# Patient Record
Sex: Male | Born: 1975 | Hispanic: No | Marital: Married | Smoking: Never smoker
Health system: Southern US, Community
[De-identification: ages and names within clinical notes are randomized; demographics above are authoritative.]

## PROBLEM LIST (undated history)

## (undated) DIAGNOSIS — G8929 Other chronic pain: Secondary | ICD-10-CM

## (undated) DIAGNOSIS — M549 Dorsalgia, unspecified: Secondary | ICD-10-CM

## (undated) DIAGNOSIS — I10 Essential (primary) hypertension: Secondary | ICD-10-CM

## (undated) HISTORY — DX: Other chronic pain: G89.29

## (undated) HISTORY — DX: Dorsalgia, unspecified: M54.9

## (undated) HISTORY — PX: APPENDECTOMY: SHX54

---

## 1998-10-13 ENCOUNTER — Emergency Department (HOSPITAL_COMMUNITY): Admission: EM | Admit: 1998-10-13 | Discharge: 1998-10-13 | Payer: Self-pay | Admitting: Emergency Medicine

## 1998-10-13 ENCOUNTER — Encounter: Payer: Self-pay | Admitting: Emergency Medicine

## 2004-06-23 ENCOUNTER — Emergency Department (HOSPITAL_COMMUNITY): Admission: EM | Admit: 2004-06-23 | Discharge: 2004-06-24 | Payer: Self-pay | Admitting: Emergency Medicine

## 2009-01-15 ENCOUNTER — Ambulatory Visit (HOSPITAL_COMMUNITY): Admission: EM | Admit: 2009-01-15 | Discharge: 2009-01-16 | Payer: Self-pay | Admitting: Emergency Medicine

## 2009-01-15 ENCOUNTER — Encounter (INDEPENDENT_AMBULATORY_CARE_PROVIDER_SITE_OTHER): Payer: Self-pay | Admitting: General Surgery

## 2010-11-02 LAB — CBC
HCT: 40.1 % (ref 39.0–52.0)
HCT: 45.5 % (ref 39.0–52.0)
Hemoglobin: 13.8 g/dL (ref 13.0–17.0)
Hemoglobin: 16 g/dL (ref 13.0–17.0)
MCHC: 35.1 g/dL (ref 30.0–36.0)
MCV: 97.1 fL (ref 78.0–100.0)
Platelets: 227 10*3/uL (ref 150–400)
Platelets: 267 K/uL (ref 150–400)
RBC: 4.69 MIL/uL (ref 4.22–5.81)
RDW: 12.8 % (ref 11.5–15.5)
WBC: 14.7 10*3/uL — ABNORMAL HIGH (ref 4.0–10.5)
WBC: 21.1 K/uL — ABNORMAL HIGH (ref 4.0–10.5)

## 2010-11-02 LAB — COMPREHENSIVE METABOLIC PANEL
BUN: 8 mg/dL (ref 6–23)
CO2: 27 mEq/L (ref 19–32)
Calcium: 9.5 mg/dL (ref 8.4–10.5)
Creatinine, Ser: 0.96 mg/dL (ref 0.4–1.5)
GFR calc non Af Amer: >60 mL/min (ref >60)
Glucose, Bld: 106 mg/dL — ABNORMAL HIGH (ref 70–99)

## 2010-11-02 LAB — URINALYSIS, ROUTINE W REFLEX MICROSCOPIC
Bilirubin Urine: NEGATIVE
Glucose, UA: NEGATIVE mg/dL
Hgb urine dipstick: NEGATIVE
Ketones, ur: NEGATIVE mg/dL
Nitrite: NEGATIVE
Protein, ur: NEGATIVE mg/dL
Specific Gravity, Urine: 1.017 (ref 1.005–1.030)
Urobilinogen, UA: 0.2 mg/dL (ref 0.0–1.0)
pH: 6 (ref 5.0–8.0)

## 2010-11-02 LAB — COMPREHENSIVE METABOLIC PANEL WITH GFR
ALT: 23 U/L (ref 0–53)
AST: 31 U/L (ref 0–37)
Albumin: 4.6 g/dL (ref 3.5–5.2)
Alkaline Phosphatase: 112 U/L (ref 39–117)
Chloride: 101 meq/L (ref 96–112)
GFR calc Af Amer: >60 mL/min (ref >60)
Potassium: 3.3 meq/L — ABNORMAL LOW (ref 3.5–5.1)
Sodium: 138 meq/L (ref 135–145)
Total Bilirubin: 2.6 mg/dL — ABNORMAL HIGH (ref 0.3–1.2)
Total Protein: 7.5 g/dL (ref 6.0–8.3)

## 2010-11-02 LAB — LIPASE, BLOOD: Lipase: 23 U/L (ref 11–59)

## 2010-11-02 LAB — DIFFERENTIAL
Basophils Absolute: 0 K/uL (ref 0.0–0.1)
Basophils Relative: 0 % (ref 0–1)
Eosinophils Absolute: 0 10*3/uL (ref 0.0–0.7)
Eosinophils Relative: 0 % (ref 0–5)
Lymphocytes Relative: 6 % — ABNORMAL LOW (ref 12–46)
Lymphs Abs: 1.3 10*3/uL (ref 0.7–4.0)
Monocytes Absolute: 1 K/uL (ref 0.1–1.0)
Monocytes Relative: 5 % (ref 3–12)
Neutro Abs: 18.8 10*3/uL — ABNORMAL HIGH (ref 1.7–7.7)
Neutrophils Relative %: 89 % — ABNORMAL HIGH (ref 43–77)

## 2010-12-08 NOTE — Op Note (Signed)
NAMERUBY, DILONE NO.:  [id number]   MEDICAL RECORD NO.:  [id number]          PATIENT TYPE:  INP   LOCATION:  0098                         FACILITY:  [facility]   PHYSICIAN:  Juanetta Gosling, MDDATE OF BIRTH:  [birth year]   DATE OF PROCEDURE:  01/15/2009  DATE OF DISCHARGE:                               OPERATIVE REPORT   REFERRING PHYSICIAN:  Gavin Pound. Ghim, MD.   PRIMARY PHYSICIAN:  Stan Head Daub, MD.   PREOPERATIVE DIAGNOSIS:  Acute appendicitis.   POSTOPERATIVE DIAGNOSIS:  Acute appendicitis.   PROCEDURE:  Laparoscopic appendectomy.   SURGEON:  Juanetta Gosling, MD   ASSISTANT:  None.   ANESTHESIA:  General.   FINDINGS:  Acute suppurative appendicitis.   SPECIMENS:  Appendix to pathology.   ESTIMATED BLOOD LOSS:  Minimal.   COMPLICATIONS:  None.   DRAINS:  None.   DISPOSITION:  To recovery room in stable condition.   INDICATIONS:  Mr. Lillard Anes is a 35 year old male with about a 12-hour  history of right lower quadrant pain that has become progressively  worse.  He has a white blood cell count of 21,000, focal right lower  quadrant pain with a positive Rovsing sign and a CT confirming  appendicitis.  He and I discussed laparoscopic, possible open  appendectomy.   PROCEDURE:  After informed consent was obtained, the patient was taken  to the operating room, and he was administered 3 grams of intravenous  Unasyn.  Sequential compression devices were placed on the lower  extremities prior to operation.  He was then taken to the operating room  and placed under general endotracheal anesthesia without complication.  His arms were tucked and appropriately padded.  Foley catheter was  placed without complication.  His abdomen was then prepped and draped in  standard sterile surgical fashion.  Surgical time-out was then  performed.   A 10-mm vertical incision was then made below his umbilicus.  Dissection  was carried out down to the level  of the fascia.  A Kocher clamp was  used to grasp his umbilicus.  An incision was then made in his fascia.  The peritoneum was entered bluntly.  A 0-Vicryl pursestring suture was  then placed in the fascia.  A Hasson trocar was then introduced and the  abdomen was insufflated to 15 mmHg pressure.  Two other 5-mm ports were  placed in the left lower quadrant and right upper quadrant after  infiltration with local anesthetic under direct vision without  complication.  His appendix was noted to be adherent to the sidewall in  anterior position.  It was noted to be acutely suppurative but did not  appear to have perforated.  The base was identified and I encircled this  using a Art gallery manager.  Following this I then used a GIA stapler to  divide the base from the cecum.  The base was clean and very clearly was  in good position with the stapler.  Following this I then used a  Harmonic scalpel to divide the mesentery, which certainly was inflamed.  The appendix was then placed  in an EndoCatch bag and removed through the  umbilical site.  Irrigation was performed.  This was clean.  Hemostasis  was observed.  There was no evidence of any purulence present or any  abscess.  All the fluid was evacuated.  I then removed the umbilical  trocar and watched this one get tied down.  There was no evidence of any  further defect in this position and no evidence of an injury.  The  abdomen was then desufflated.  The other trocars were removed.  The 4-0  Monocryl was used to close the skin.  Dermabond was placed over the  wounds.  His Foley catheter was removed.  He was extubated in the  operating room.  He was then transferred to the recovery room in stable  condition.      Juanetta Gosling, MD  Electronically Signed     MCW/MEDQ  D:  01/15/2009  T:  01/16/2009  Job:  [id number]   cc:   Brett Canales A. Cleta Alberts, M.D.  Fax: [phone]

## 2010-12-08 NOTE — H&P (Signed)
Bryan [name], SENFT NO.:  [id number]   MEDICAL RECORD NO.:  [id number]          PATIENT TYPE:  EMS   LOCATION:  ED                           FACILITY[facility]  [facility]   PHYSICIAN:  Juanetta Gosling, MDDATE OF BIRTH:  1976-02-03   DATE OF ADMISSION:  01/15/2009  DATE OF DISCHARGE:                              HISTORY & PHYSICAL   REFERRING PHYSICIAN:  Lear Ng, M.D.   CHIEF COMPLAINT:  Abdominal pain.   HISTORY OF PRESENT ILLNESS:  This is a 35 year old male who was  otherwise healthy, who developed right lower quadrant pain at about 7:30  this morning today.  Had no prior history of this.  This has been  progressively worsening over the day with no real relieving factors.  This was aggravated by the drive over.  Does have a fever at home.  Does  have nausea.  He is also anorexic.  He has no episodes of any emesis.   PAST MEDICAL HISTORY:  Back pain.   PAST SURGICAL HISTORY:  Negative.   FAMILY HISTORY:  Noncontributory.   SOCIAL HISTORY:  No alcohol, no smoking.  He works as a Therapist, occupational, doing heavy lifting.   ALLERGIES:  No known drug allergies.   MEDICATIONS:  None.   REVIEW OF SYSTEMS:  Negative.   PHYSICAL EXAMINATION:  Temperature 102.2, pulse 87, respirations 16,  blood pressure 129/81.  GENERAL:  He is a well-appearing male in no distress.  Sclerae are anicteric.  NECK:  Supple without adenopathy.  HEART:  Regular rate and rhythm.  CHEST:  Clear bilaterally.  ABDOMEN:  Tender in the right lower quadrant.  Positive Rovsing's sign.  EXTREMITIES:  No edema.   LABORATORY EVALUATION:  White blood cell 19.7, hematocrit 46.2,  platelets 301.  BUN 8, creatinine 0.96, bilirubin 2.6.  Urinalysis is  negative.   CT scan shows acute appendicitis.   IMPRESSION:  Acute appendicitis   PLAN OF ACTION:  IV antibiotics, resuscitation, and will plan on  appendectomy.  He and I discussed laparoscopic, possible open  appendectomy, with risks  being bleeding, infection, postoperative  abscess, and injury to the surrounding structures.  He understands and  will proceed as quickly as possible.      Juanetta Gosling, MD  Electronically Signed     MCW/MEDQ  D:  01/15/2009  T:  01/15/2009  Job:  [id number]

## 2011-06-30 ENCOUNTER — Ambulatory Visit (INDEPENDENT_AMBULATORY_CARE_PROVIDER_SITE_OTHER): Payer: BC Managed Care – PPO

## 2011-06-30 DIAGNOSIS — J111 Influenza due to unidentified influenza virus with other respiratory manifestations: Secondary | ICD-10-CM

## 2011-09-20 ENCOUNTER — Ambulatory Visit (INDEPENDENT_AMBULATORY_CARE_PROVIDER_SITE_OTHER): Payer: BC Managed Care – PPO | Admitting: Physician Assistant

## 2011-09-20 VITALS — BP 110/84 | HR 64 | Temp 98.0°F | Resp 16 | Ht 69.5 in | Wt 227.6 lb

## 2011-09-20 DIAGNOSIS — J069 Acute upper respiratory infection, unspecified: Secondary | ICD-10-CM

## 2011-09-20 DIAGNOSIS — J029 Acute pharyngitis, unspecified: Secondary | ICD-10-CM

## 2011-09-20 MED ORDER — PROMETHAZINE-DM 6.25-15 MG/5ML PO SYRP: 5.0000 mL | ORAL_SOLUTION | Freq: Every day | ORAL | Status: AC

## 2011-09-20 NOTE — Progress Notes (Signed)
  Subjective:    Patient ID: Bryan [name], male    DOB: Jun 26, 1976, 36 y.o.   MRN: [id number]  HPI Bryan [name] c/o head congestion for 5 days with severe ST.  Minimal cough but feels it is worsening. Says he felt feverish the first 2 days.  Tylenol only     Review of Systems  HENT: Positive for congestion, sore throat, rhinorrhea and sinus pressure.   Respiratory: Positive for cough (mild).   Musculoskeletal: Positive for myalgias.  Neurological: Positive for headaches.       Objective:   Physical Exam  Constitutional: He appears well-developed and well-nourished.  HENT:  Right Ear: Tympanic membrane normal.  Left Ear: Tympanic membrane normal.  Nose: Mucosal edema and rhinorrhea present.  Mouth/Throat: Posterior oropharyngeal erythema present.  Cardiovascular: Normal rate and regular rhythm.   Pulmonary/Chest: Effort normal and breath sounds normal.  Lymphadenopathy:    He has no cervical adenopathy.    Filed Vitals:   09/20/11 1212  BP: 110/84  Pulse: 64  Temp: 98 F (36.7 C)  Resp: 16         Assessment & Plan:  URI Pharyngitis  Dukes Magic MW with Viscous Lidocaine 2% 1:1 1 tsp swish/spit prn 4 oz  Phenergan DM for cough Mucinex DM Return if worsens

## 2011-12-17 ENCOUNTER — Ambulatory Visit (INDEPENDENT_AMBULATORY_CARE_PROVIDER_SITE_OTHER): Payer: 59 | Admitting: Family Medicine

## 2011-12-17 DIAGNOSIS — M549 Dorsalgia, unspecified: Secondary | ICD-10-CM

## 2011-12-17 DIAGNOSIS — K5900 Constipation, unspecified: Secondary | ICD-10-CM | POA: Insufficient documentation

## 2011-12-17 DIAGNOSIS — K649 Unspecified hemorrhoids: Secondary | ICD-10-CM

## 2011-12-17 LAB — COMPREHENSIVE METABOLIC PANEL
AST: 30 U/L (ref 0–37)
Albumin: 4.7 g/dL (ref 3.5–5.2)
Alkaline Phosphatase: 73 U/L (ref 39–117)
BUN: 13 mg/dL (ref 6–23)
Creat: 0.94 mg/dL (ref 0.50–1.35)
Potassium: 4.3 mEq/L (ref 3.5–5.3)
Total Bilirubin: 1.6 mg/dL — ABNORMAL HIGH (ref 0.3–1.2)

## 2011-12-17 LAB — POCT CBC
Granulocyte percent: 48 %G (ref 37–80)
MCV: 97.7 fL — AB (ref 80–97)
MID (cbc): 0.5 (ref 0–0.9)
POC LYMPH PERCENT: 44.8 %L (ref 10–50)
Platelet Count, POC: 283 10*3/uL (ref 142–424)
RDW, POC: 13.1 %

## 2011-12-17 MED ORDER — METHOCARBAMOL 750 MG PO TABS: 750.0000 mg | ORAL_TABLET | Freq: Four times a day (QID) | ORAL | Status: AC

## 2011-12-17 MED ORDER — HYDROCORTISONE ACE-PRAMOXINE 2.5-1 % RE CREA: TOPICAL_CREAM | Freq: Three times a day (TID) | RECTAL | Status: AC

## 2011-12-17 MED ORDER — NAPROXEN 500 MG PO TABS: 500.0000 mg | ORAL_TABLET | Freq: Two times a day (BID) | ORAL | Status: DC

## 2011-12-17 NOTE — Progress Notes (Signed)
Subjective 36 year old Hispanic male with history of having had an appendectomy 4 years ago. Since that time he has had some chronic problem with constipation. He has some fiber that he got a health food store, and he takes it he has a good bowel movement but the rest of the time he only moves a small amount every day. 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 He has been having pain in his left low back. It has  especially bad this week. No real specific injury this time. Hemorrhoids intermittentlhy.  Objective Abdomen soft. Mild right midabdominal tenderness. Normal bowel sounds. Spine appears grossly normal. Only mild tenderness of the lower lumbar spine. There is some paraspinous muscles on the liver tender on the left, both mid back and low back. Most of the tenderness is in the region of the left SI joint. Range of motion is fairly adequate. Straight leg raise is negative. Normal rectal  Assessment: Back strain Constipation Intermittent hemorrhoids   Patient has concerns about his colon with chronic constipation and some concerns about cancer risks.  Results for orders placed in visit on 12/17/11  POCT CBC      Component Value Range   WBC 7.1  4.6 - 10.2 (K/uL)   Lymph, poc 3.2  0.6 - 3.4    POC LYMPH PERCENT 44.8  10 - 50 (%L)   MID (cbc) 0.5  0 - 0.9    POC MID % 7.2  0 - 12 (%M)   POC Granulocyte 3.4  2 - 6.9    Granulocyte percent 48.0  37 - 80 (%G)   RBC 4.70  4.69 - 6.13 (M/uL)   Hemoglobin 15.3  14.1 - 18.1 (g/dL)   HCT, POC 40.9  81.1 - 53.7 (%)   MCV 97.7 (*) 80 - 97 (fL)   MCH, POC 32.6 (*) 27 - 31.2 (pg)   MCHC 33.3  31.8 - 35.4 (g/dL)   RDW, POC 91.4     Platelet Count, POC 283  142 - 424 (K/uL)   MPV 8.4  0 - 99.8 (fL)   Will give analpram for hemorroids prn. Naprosyn 500 bid prn back Robaxin 750 qid prn muscle relaxant

## 2011-12-17 NOTE — Patient Instructions (Signed)
Constipacin en los adultos  (Constipation in Adults)  Se denomina constipacin al hecho de mover el intestino menos de dos veces por semana. Generalmente las heces son duras. A medida que envejecemos, la constipacin es ms frecuente. Si trata de solucionarlo con laxantes, puede empeorar el problema. Los laxantes utilizados durante largos perodos pueden debilitar los msculos del colon. Esto har que la constipacin empeore gradualmente. Una dieta pobre en fibras, la ingesta insuficiente de lquidos y algunos medicamentos pueden empeorar el problema.  ALGUNOS MEDICAMENTOS QUE PUEDEN CAUSAR CONSTIPACIN SON:   Diurticos.   Boqueadotes de los canales de calcio (medicamento utilizado para controlar la presin arterial y el funcionamiento cardaco.   Narcticos (ciertos medicamentos para el dolor).   Anticolinrgicos.   Antiinflamatorios.   Anticidos que contengan aluminio.  ALGUNOS MEDICAMENTOS QUE PUEDEN CAUSAR CONSTIPACIN SON:    Diabetes.   Enfermedad de Parkinson.   Demencia (la mente no funciona adecuadamente y existen trastornos del pensamiento).   Ictus.   Depresin.   Otras enfermedades que causan dificultades con el metabolismo de sales y lquidos.  INSTRUCCIONES PARA EL CUIDADO DOMICILIARIO   El mejor tratamiento para la constipacin es el que se realiza sin tomar medicamentos. Es importante consumir ms fibras, frutas y vegetales.   Aumente lentamente el consumo de fibras a 25 a 38 gramos por da. Granos integrales, frutas, verduras y legumbres son buenas fuentes de fibra. Un dietista podr ayudarlo a incorporar alimentos altos en fibra en su dieta.   Beba gran cantidad de lquido para mantener la orina de tono claro o color amarillo plido.   Deber aadir un suplemento de fibra en su dieta si no puede recibir la cantidad suficiente a partir de los alimentos.   Aumentar la actividad fsica tambin ayuda a mejorar la regularidad.   Los supositorios, segn lo haya indicado el mdico,  podrn ser de utilidad. Si toma anticidos u otros productos que contengan aluminio o calcio, los que pueden causar constipacin, ser de gran ayuda cambiarlos por productos que contengan magnesio, con la aprobacin del mdico.   Si en el da de hoy le han aplicado un enema, considere que esta slo es una medida temporaria y no debe considerarse como tratamiento para la constipacin de larga data (crnica). Si utiliza enemas durante mucho tiempo, se debilitarn los msculos del colon y la constipacin empeorar.   Tambin deben evitarse en lo posible medidas ms enrgicas, como el consumo de sulfato de magnesio, siempre que sea posible. El sulfato de magnesio puede causar diarrea incontrolable. Sin embargo, si usted es una persona de edad avanzada, estas medidas pueden ser tentadoras. En algunos casos, este tipo de medidas ni siquiera le dan tiempo para llegar al bao.  SOLICITE ATENCIN MDICA DE INMEDIATO SI:   Observa sangre de color rojo brillante en las heces.   El estreimiento persiste durante ms de cuatro das.   Presenta dolor abdominal o rectal junto con el estreimiento.   No parece sentirse mejor.   Tiene preguntas para formular, o alguna preocupacin, o no mejora.  EST SEGURO QUE:    Comprende las instrucciones para el alta mdica.   Controlar su enfermedad.   Solicitar atencin mdica de inmediato segn las indicaciones.  Document Released: 08/01/2007 Document Revised: 07/01/2011  Exit[employer] Patient Information 2012 [employer].

## 2012-01-03 LAB — IFOBT (OCCULT BLOOD): IFOBT: NEGATIVE

## 2012-07-21 ENCOUNTER — Ambulatory Visit (INDEPENDENT_AMBULATORY_CARE_PROVIDER_SITE_OTHER): Payer: 59 | Admitting: Family Medicine

## 2012-07-21 ENCOUNTER — Ambulatory Visit: Payer: 59

## 2012-07-21 VITALS — BP 118/72 | HR 88 | Temp 99.5°F | Resp 16 | Ht 69.0 in | Wt 224.2 lb

## 2012-07-21 DIAGNOSIS — R059 Cough, unspecified: Secondary | ICD-10-CM

## 2012-07-21 DIAGNOSIS — J029 Acute pharyngitis, unspecified: Secondary | ICD-10-CM

## 2012-07-21 DIAGNOSIS — R05 Cough: Secondary | ICD-10-CM

## 2012-07-21 DIAGNOSIS — J111 Influenza due to unidentified influenza virus with other respiratory manifestations: Secondary | ICD-10-CM

## 2012-07-21 DIAGNOSIS — R509 Fever, unspecified: Secondary | ICD-10-CM

## 2012-07-21 DIAGNOSIS — Z789 Other specified health status: Secondary | ICD-10-CM

## 2012-07-21 DIAGNOSIS — J189 Pneumonia, unspecified organism: Secondary | ICD-10-CM

## 2012-07-21 LAB — POCT INFLUENZA A/B
Influenza A, POC: POSITIVE
Influenza B, POC: NEGATIVE

## 2012-07-21 LAB — POCT RAPID STREP A (OFFICE): Rapid Strep A Screen: NEGATIVE

## 2012-07-21 MED ORDER — AZITHROMYCIN 250 MG PO TABS: ORAL_TABLET | ORAL | Status: DC

## 2012-07-21 MED ORDER — HYDROCODONE-HOMATROPINE 5-1.5 MG/5ML PO SYRP: ORAL_SOLUTION | ORAL | Status: DC

## 2012-07-21 NOTE — Progress Notes (Signed)
  Subjective:    Patient ID: Bryan [name], male    DOB: [birth year], 36 y.o.   MRN: [id number]  HPI Bryan [name] Bryan [name] is a 36 y.o. male C/o cough, sore throat, nasal congestion, and fever. Initially fever. Last night 101.2. Cough at night, sore throat during day.   Possible sick contacts with kids at home - younger child diagnosed with flu at pediatrician's office after his symptoms.   Tx: ibuprofen. Also took antibiotic from [place] - 4 pills only.    No flu vaccine this year.   Spanish speaking only - spanish spoken - understanding expressed. Review of Systems  Constitutional: Positive for fever and chills.  Respiratory: Positive for cough. Negative for shortness of breath and wheezing.        Objective:   Physical Exam  Vitals reviewed. Constitutional: He is oriented to person, place, and time. He appears well-developed and well-nourished.  HENT:  Head: Normocephalic and atraumatic.  Right Ear: Tympanic membrane, external ear and ear canal normal.  Left Ear: Tympanic membrane, external ear and ear canal normal.  Nose: No rhinorrhea.  Mouth/Throat: Oropharynx is clear and moist and mucous membranes are normal. No oropharyngeal exudate or posterior oropharyngeal erythema.  Eyes: Conjunctivae normal are normal. Pupils are equal, round, and reactive to light.  Neck: Neck supple.  Cardiovascular: Normal rate, regular rhythm, normal heart sounds and intact distal pulses.   No murmur heard. Pulmonary/Chest: Effort normal and breath sounds normal. He has no wheezes. He has no rhonchi. He has no rales.  Abdominal: Soft. There is no tenderness.  Lymphadenopathy:    He has no cervical adenopathy.  Neurological: He is alert and oriented to person, place, and time.  Skin: Skin is warm and dry. No rash noted.  Psychiatric: He has a normal mood and affect. His behavior is normal.   Results for orders placed in visit on 07/21/12  POCT RAPID STREP A (OFFICE)   Component Value Range   Rapid Strep A Screen Negative  Negative  POCT INFLUENZA A/B      Component Value Range   Influenza A, POC Positive     Influenza B, POC Negative      [facility] reading (PRIMARY) by  Dr. Neva Seat: CXR: increased RML greater than LLL markings. .    Assessment & Plan:  Bryan [name] is a 36 y.o. male 1. Cough  POCT Influenza A/B, DG Chest 2 View, azithromycin (ZITHROMAX) 250 MG tablet, HYDROcodone-homatropine (HYCODAN) 5-1.5 MG/5ML syrup  2. Fever  POCT Influenza A/B, azithromycin (ZITHROMAX) 250 MG tablet  3. Sore throat  POCT rapid strep A  4. Influenza    5. Pneumonia  azithromycin (ZITHROMAX) 250 MG tablet  6. Language barrier     Influenza with prolonged fever and now more productive cough/chest symptoms.  Possible post viral pna. Start z pak, hycodan qhs if needed, mucinex during the day, and rtc precautions discussed.  Spanish spokne, understanding expressed.   Patient Instructions  Tome mucinex en dia por tos, liquido por tos en noche. Azithromycin por infection.  si no esta mejor en proximo 3-4 dias, regrese a Event organiser.  va a cuarto de Education administrator.

## 2012-07-21 NOTE — Patient Instructions (Addendum)
Tome mucinex en dia por tos, liquido por tos en noche. Azithromycin por infection.  si no esta mejor en proximo 3-4 dias, regrese a Event organiser.  va a cuarto de Education administrator.

## 2012-10-29 ENCOUNTER — Emergency Department (HOSPITAL_COMMUNITY)
Admission: EM | Admit: 2012-10-29 | Discharge: 2012-10-29 | Disposition: A | Discharge status: Home / Self Care | Payer: 59 | Attending: Emergency Medicine | Admitting: Emergency Medicine

## 2012-10-29 ENCOUNTER — Emergency Department (HOSPITAL_COMMUNITY): Payer: 59

## 2012-10-29 ENCOUNTER — Encounter (HOSPITAL_COMMUNITY): Payer: Self-pay | Admitting: *Deleted

## 2012-10-29 DIAGNOSIS — R059 Cough, unspecified: Secondary | ICD-10-CM | POA: Insufficient documentation

## 2012-10-29 DIAGNOSIS — R05 Cough: Secondary | ICD-10-CM | POA: Insufficient documentation

## 2012-10-29 DIAGNOSIS — G8929 Other chronic pain: Secondary | ICD-10-CM | POA: Insufficient documentation

## 2012-10-29 DIAGNOSIS — J029 Acute pharyngitis, unspecified: Secondary | ICD-10-CM

## 2012-10-29 LAB — RAPID STREP SCREEN (MED CTR MEBANE ONLY): Streptococcus, Group A Screen (Direct): NEGATIVE

## 2012-10-29 MED ORDER — ACETAMINOPHEN 325 MG PO TABS
650.0000 mg | ORAL_TABLET | Freq: Four times a day (QID) | ORAL | Status: DC | PRN
  Administered 2012-10-29: 650 mg via ORAL
  Filled 2012-10-29: qty 2

## 2012-10-29 MED ORDER — DEXAMETHASONE 6 MG PO TABS
10.0000 mg | ORAL_TABLET | Freq: Once | ORAL | Status: AC
  Administered 2012-10-29: 10 mg via ORAL
  Filled 2012-10-29: qty 1

## 2012-10-29 MED ORDER — PENICILLIN G BENZATHINE 1200000 UNIT/2ML IM SUSP
1.2000 10*6.[IU] | Freq: Once | INTRAMUSCULAR | Status: AC
  Administered 2012-10-29: 1.2 10*6.[IU] via INTRAMUSCULAR
  Filled 2012-10-29: qty 2

## 2012-10-29 MED ORDER — DEXTROMETHORPHAN POLISTIREX 30 MG/5ML PO LQCR: 60.0000 mg | ORAL | Status: DC | PRN

## 2012-10-29 MED ORDER — DEXAMETHASONE SODIUM PHOSPHATE 10 MG/ML IJ SOLN: 10.0000 mg | Freq: Once | INTRAMUSCULAR | Status: DC

## 2012-10-29 NOTE — ED Notes (Signed)
Pt states all day yesterday fever and sore throat but didn't take any medications for fever or pain

## 2012-10-29 NOTE — ED Notes (Signed)
Pt is sitting up in chair in room. VSS. States he feels much better now that he has had some Tylenol. States he still has a little throat discomfort but 50%better than it was.

## 2012-10-29 NOTE — ED Provider Notes (Signed)
History     CSN: [id number]  Arrival date & time 10/29/12  0441   First MD Initiated Contact with Patient 10/29/12 0600      Chief Complaint  Patient presents with  . Fever  . Sore Throat    (Consider location/radiation/quality/duration/timing/severity/associated sxs/prior treatment) HPI Comments: Patient presents to the ED with a chief complaint of sore throat and fever x 1 day.  Patient states that he started feeling bad yesterday.  He has not tried taking any medications for his symptoms.  He denies any chest pain, SOB, nausea, or vomiting.  He endorses mild cough.  Denies any difficulty breathing.  He states that his pain is moderate, however, he says that he is feeling a lot better after having taken the tylenol here in the ED.  The history is provided by the patient. No language interpreter was used.    Past Medical History  Diagnosis Date  . Chronic back pain     Past Surgical History  Procedure Laterality Date  . Appendectomy      History reviewed. No pertinent family history.  History  Substance Use Topics  . Smoking status: Never Smoker   . Smokeless tobacco: Not on file  . Alcohol Use: No      Review of Systems  All other systems reviewed and are negative.    Allergies  Review of patient's allergies indicates no known allergies.  Home Medications  No current outpatient prescriptions on file.  BP 155/79  Pulse 124  Temp(Src) 101.9 F (38.8 C) (Oral)  Resp 18  SpO2 96%  Physical Exam  Nursing note and vitals reviewed. Constitutional: He is oriented to person, place, and time. He appears well-developed and well-nourished.  HENT:  Head: Normocephalic and atraumatic.  Right Ear: External ear normal.  Left Ear: External ear normal.  Red and inflamed oropharynx, but without any exudates, no signs of tonsillar or peritonsillar abscesses.  No signs of Ludwig's angina.  Eyes: Conjunctivae and EOM are normal. Pupils are equal, round, and reactive to  light.  Neck: Normal range of motion. Neck supple.  Cardiovascular: Normal rate, regular rhythm and normal heart sounds.  Exam reveals no gallop and no friction rub.   No murmur heard. Not tachycardic on my exam  Pulmonary/Chest: Effort normal and breath sounds normal. No respiratory distress. He has no wheezes. He has no rales. He exhibits no tenderness.  Abdominal: Soft. Bowel sounds are normal.  Musculoskeletal: Normal range of motion.  Lymphadenopathy:    He has cervical adenopathy.  Neurological: He is alert and oriented to person, place, and time.  Skin: Skin is warm and dry.  Psychiatric: He has a normal mood and affect. His behavior is normal. Judgment and thought content normal.    ED Course  Procedures (including critical care time)  Labs Reviewed  RAPID STREP SCREEN  URINALYSIS, ROUTINE W REFLEX MICROSCOPIC   Dg Chest 2 View  10/29/2012  *RADIOLOGY REPORT*  Clinical Data:  Chest pain, cough and fever.  CHEST - 2 VIEW  Comparison: None  Findings: The heart size and mediastinal contours are within normal limits.  Both lungs are clear.  The visualized skeletal structures are unremarkable.  IMPRESSION: No active disease.   Original Report Authenticated By: Irish Lack, M.D.      1. Pharyngitis       MDM  Patient with sore throat and fever.  I am going to treat the patient for pharyngitis based on physical exam findings and fever.  Will  have the patient follow up with his PCP.    Patient is stable and ready for discharge.  He has follow-up tomorrow with PCP.  Tylenol and motrin for fevers.        Roxy Horseman, PA-C 10/29/12 [phone]

## 2012-10-30 NOTE — ED Provider Notes (Signed)
Medical screening examination/treatment/procedure(s) were performed by non-physician practitioner and as supervising physician I was immediately available for consultation/collaboration.   Loren Racer, MD 10/30/12 [phone]

## 2013-10-22 ENCOUNTER — Ambulatory Visit (INDEPENDENT_AMBULATORY_CARE_PROVIDER_SITE_OTHER): Payer: 59 | Admitting: Emergency Medicine

## 2013-10-22 VITALS — BP 110/78 | HR 81 | Temp 98.2°F | Resp 16 | Ht 69.0 in | Wt 236.0 lb

## 2013-10-22 DIAGNOSIS — H5710 Ocular pain, unspecified eye: Secondary | ICD-10-CM

## 2013-10-22 DIAGNOSIS — H109 Unspecified conjunctivitis: Secondary | ICD-10-CM

## 2013-10-22 MED ORDER — ACETAMINOPHEN-CODEINE #3 300-30 MG PO TABS: 1.0000 | ORAL_TABLET | ORAL | Status: DC | PRN

## 2013-10-22 MED ORDER — OFLOXACIN 0.3 % OP SOLN: 1.0000 [drp] | OPHTHALMIC | Status: DC

## 2013-10-22 NOTE — Progress Notes (Signed)
[facility] [address], [place] [id number] [phone]  Date:  10/22/2013   Name:  Bryan [name]   DOB:  January 18, 1976   MRN:  [id number]  PCP:  No primary provider on file.    Chief Complaint: Eye Pain   History of Present Illness:  Bryan [name] is a 38 y.o. very pleasant male patient who presents with the following:  1 week ago had a pain in his right eye.  Works with metal in a shop.  Has increasing pain and drainage.  No FB sensation or visual symptoms. Some photophobia.  No improvement with over the counter medications or other home remedies. Denies other complaint or health concern today.   Patient Active Problem List   Diagnosis Date Noted  . Constipation 12/17/2011    Past Medical History  Diagnosis Date  . Chronic back pain     Past Surgical History  Procedure Laterality Date  . Appendectomy      History  Substance Use Topics  . Smoking status: Never Smoker   . Smokeless tobacco: Not on file  . Alcohol Use: No    No family history on file.  No Known Allergies  Medication list has been reviewed and updated.  No current outpatient prescriptions on file prior to visit.   No current facility-administered medications on file prior to visit.    Review of Systems:  As per HPI, otherwise negative.    Physical Examination: Filed Vitals:   10/22/13 1926  BP: 110/78  Pulse: 81  Temp: 98.2 F (36.8 C)  Resp: 16   Filed Vitals:   10/22/13 1926  Height: 5\' 9"  (1.753 m)  Weight: 236 lb (107.049 kg)   Body mass index is 34.84 kg/(m^2). Ideal Body Weight: Weight in (lb) to have BMI = 25: 168.9   GEN: WDWN, NAD, Non-toxic, Alert & Oriented x 3 HEENT: Atraumatic, Normocephalic.  RIGHT eye:  Injected.  PRRERLA EOMI cornea clear.  No hyphema or hypopion.  No FB.  No fluorescein uptake. Ears and Nose: No external deformity. EXTR: No clubbing/cyanosis/edema NEURO: Normal gait.  PSYCH: Normally  interactive. Conversant. Not depressed or anxious appearing.  Calm demeanor.    Assessment and Plan: Right Eye Pain Start on ocuflox Eye referral  Signed,  Phillips OdorJeffery Yvonda Fouty, MD

## 2013-10-30 ENCOUNTER — Ambulatory Visit (INDEPENDENT_AMBULATORY_CARE_PROVIDER_SITE_OTHER): Payer: 59 | Admitting: Emergency Medicine

## 2013-10-30 VITALS — BP 110/70 | HR 86 | Temp 98.4°F | Resp 16 | Ht 69.5 in | Wt 240.0 lb

## 2013-10-30 DIAGNOSIS — R05 Cough: Secondary | ICD-10-CM

## 2013-10-30 DIAGNOSIS — J029 Acute pharyngitis, unspecified: Secondary | ICD-10-CM

## 2013-10-30 DIAGNOSIS — R059 Cough, unspecified: Secondary | ICD-10-CM

## 2013-10-30 MED ORDER — BENZONATATE 200 MG PO CAPS: 200.0000 mg | ORAL_CAPSULE | Freq: Three times a day (TID) | ORAL | Status: DC | PRN

## 2013-10-30 MED ORDER — NAPROXEN 500 MG PO TABS: 500.0000 mg | ORAL_TABLET | Freq: Two times a day (BID) | ORAL | Status: DC

## 2013-10-30 NOTE — Patient Instructions (Signed)

## 2013-10-30 NOTE — Progress Notes (Signed)
   Subjective:    Patient ID: Bryan [name], male    DOB: October 07, 1975, 38 y.o.   MRN: [id number]  HPI 38 yo male with complaint of cough and sore throat for last several days.  No fever or chills.  No headache.  No nausea or vomiting.  States cough is nonproductive.  No sick contacts.  No relief with salt water garggles.  Taking otc motrin without major improvement.  PPMH:  Noncontributory  SH:  Nonsmoker, no alcohol   Review of Systems  HENT: Positive for sore throat and trouble swallowing. Negative for ear discharge, ear pain, postnasal drip, rhinorrhea, sinus pressure and sneezing.   Respiratory: Positive for cough. Negative for shortness of breath.   Cardiovascular: Negative for chest pain and leg swelling.  Gastrointestinal: Negative for nausea, vomiting, diarrhea and constipation.       Objective:   Physical Exam Blood pressure 110/70, pulse 86, temperature 98.4 F (36.9 C), temperature source Oral, resp. rate 16, height 5' 9.5" (1.765 m), weight 240 lb (108.863 kg), SpO2 97.00%. Body mass index is 34.95 kg/(m^2). Well-developed, well nourished male who is awake, alert and oriented, in NAD. HEENT: [place]/AT, PERRL, EOMI.  Sclera and conjunctiva are clear.  EAC are patent, TMs are normal in appearance. Nasal mucosa is pink and moist. OP with mild erythema, no exudates. Neck: supple, non-tender, no lymphadenopathy, thyromegaly. Heart: RRR, no murmur Lungs: normal effort, CTA Abdomen: normo-active bowel sounds, supple, non-tender, no mass or organomegaly. Extremities: no cyanosis, clubbing or edema. Skin: warm and dry without rash. Psychologic: good mood and appropriate affect, normal speech and behavior.     Assessment & Plan:  Viral URI  Given Rx for Naprosyn 500mg  and tesselon perles.  Follow up as needed.

## 2013-11-03 ENCOUNTER — Ambulatory Visit (INDEPENDENT_AMBULATORY_CARE_PROVIDER_SITE_OTHER): Payer: 59 | Admitting: Family Medicine

## 2013-11-03 VITALS — BP 124/80 | HR 89 | Temp 98.1°F | Ht 69.5 in | Wt 234.0 lb

## 2013-11-03 DIAGNOSIS — R059 Cough, unspecified: Secondary | ICD-10-CM

## 2013-11-03 DIAGNOSIS — R05 Cough: Secondary | ICD-10-CM

## 2013-11-03 DIAGNOSIS — J45909 Unspecified asthma, uncomplicated: Secondary | ICD-10-CM

## 2013-11-03 DIAGNOSIS — J029 Acute pharyngitis, unspecified: Secondary | ICD-10-CM

## 2013-11-03 MED ORDER — HYDROCODONE-HOMATROPINE 5-1.5 MG/5ML PO SYRP: 5.0000 mL | ORAL_SOLUTION | ORAL | Status: DC | PRN

## 2013-11-03 MED ORDER — METHYLPREDNISOLONE ACETATE 80 MG/ML IJ SUSP
80.0000 mg | Freq: Once | INTRAMUSCULAR | Status: AC
  Administered 2013-11-03: 80 mg via INTRAMUSCULAR

## 2013-11-03 MED ORDER — BENZONATATE 200 MG PO CAPS: 200.0000 mg | ORAL_CAPSULE | Freq: Three times a day (TID) | ORAL | Status: DC | PRN

## 2013-11-03 MED ORDER — AMOXICILLIN 875 MG PO TABS: 875.0000 mg | ORAL_TABLET | Freq: Two times a day (BID) | ORAL | Status: DC

## 2013-11-03 NOTE — Progress Notes (Signed)
Subjective: 38 year old man who has had persistent respiratory symptoms. He was here a couple weeks ago with his eyes bothering him. He came in last week 5 days ago with a sore throat. He has persisted with a sore throat, and has also been coughing a lot, with a cough bothering him at night. He has not smoked. He had a similar problem a year ago. Apparently his wife and child both have the same problem.  Objective: TMs normal. Throat mildly erythematous without exudate. Neck supple without significant nodes. Chest clear. He is coughing. Heart regular without murmurs.  Assessment: Bronchitis Pharyngitis Recent conjunctivitis I suspect all of this is probably allergy that he could have some infection there in the sinuses causing it to keep hurting.  Plan: Depo-Medrol Amoxicillin Continue antihistamine Cough pills and cough syrup

## 2013-11-03 NOTE — Patient Instructions (Signed)
Drink plenty of fluids and get enough rest  Continue taking the allergy pill  Take the cough syrup 1 teaspoon every 4-6 hours when needed for bad cough. It will make you drowsy, so his best use at bedtime  Take the cough pills(benzonatate) in the daytime one or 2 pills 3 times daily if needed  Take the amoxicillin 875 mg one twice daily for infection  Return if worse  You have received shot of 80 mg of Depo-Medrol for allergy

## 2014-09-02 ENCOUNTER — Ambulatory Visit (INDEPENDENT_AMBULATORY_CARE_PROVIDER_SITE_OTHER): Payer: 59 | Admitting: Internal Medicine

## 2014-09-02 VITALS — BP 118/80 | HR 71 | Temp 98.2°F | Resp 18 | Ht 69.5 in | Wt 240.4 lb

## 2014-09-02 DIAGNOSIS — M5442 Lumbago with sciatica, left side: Secondary | ICD-10-CM

## 2014-09-02 DIAGNOSIS — R29898 Other symptoms and signs involving the musculoskeletal system: Secondary | ICD-10-CM

## 2014-09-02 DIAGNOSIS — M25522 Pain in left elbow: Secondary | ICD-10-CM

## 2014-09-02 DIAGNOSIS — M7522 Bicipital tendinitis, left shoulder: Secondary | ICD-10-CM

## 2014-09-02 DIAGNOSIS — E65 Localized adiposity: Secondary | ICD-10-CM

## 2014-09-02 DIAGNOSIS — M7521 Bicipital tendinitis, right shoulder: Secondary | ICD-10-CM

## 2014-09-02 DIAGNOSIS — M25552 Pain in left hip: Secondary | ICD-10-CM

## 2014-09-02 LAB — COMPREHENSIVE METABOLIC PANEL
ALK PHOS: 74 U/L (ref 39–117)
ALT: 39 U/L (ref 0–53)
AST: 27 U/L (ref 0–37)
Albumin: 4.6 g/dL (ref 3.5–5.2)
BUN: 11 mg/dL (ref 6–23)
CHLORIDE: 100 meq/L (ref 96–112)
CO2: 30 meq/L (ref 19–32)
CREATININE: 0.73 mg/dL (ref 0.50–1.35)
Calcium: 9.4 mg/dL (ref 8.4–10.5)
Glucose, Bld: 95 mg/dL (ref 70–99)
Potassium: 4.5 mEq/L (ref 3.5–5.3)
Sodium: 136 mEq/L (ref 135–145)
TOTAL PROTEIN: 7.1 g/dL (ref 6.0–8.3)
Total Bilirubin: 1.2 mg/dL (ref 0.2–1.2)

## 2014-09-02 LAB — POCT CBC
Granulocyte percent: 44.7 %G (ref 37–80)
HCT, POC: 47.1 % (ref 43.5–53.7)
Hemoglobin: 16.1 g/dL (ref 14.1–18.1)
LYMPH, POC: 3.4 (ref 0.6–3.4)
MCH: 33 pg — AB (ref 27–31.2)
MCHC: 34.2 g/dL (ref 31.8–35.4)
MCV: 96.6 fL (ref 80–97)
MID (cbc): 0.4 (ref 0–0.9)
MPV: 7.3 fL (ref 0–99.8)
POC Granulocyte: 3.1 (ref 2–6.9)
POC LYMPH PERCENT: 49.4 %L (ref 10–50)
POC MID %: 5.9 %M (ref 0–12)
Platelet Count, POC: 284 10*3/uL (ref 142–424)
RBC: 4.87 M/uL (ref 4.69–6.13)
RDW, POC: 12.7 %
WBC: 6.9 10*3/uL (ref 4.6–10.2)

## 2014-09-02 LAB — LIPID PANEL
CHOL/HDL RATIO: 5 ratio
CHOLESTEROL: 159 mg/dL (ref 0–200)
HDL: 32 mg/dL — AB (ref >39)
LDL Cholesterol: 62 mg/dL (ref 0–99)
TRIGLYCERIDES: 323 mg/dL — AB (ref <150)
VLDL: 65 mg/dL — AB (ref 0–40)

## 2014-09-02 LAB — GLUCOSE, POCT (MANUAL RESULT ENTRY): POC Glucose: 88 mg/dl (ref 70–99)

## 2014-09-02 LAB — POCT SEDIMENTATION RATE: POCT SED RATE: 2 mm/h (ref 0–22)

## 2014-09-02 MED ORDER — CYCLOBENZAPRINE HCL 10 MG PO TABS: 10.0000 mg | ORAL_TABLET | Freq: Every day | ORAL | Status: DC

## 2014-09-02 MED ORDER — MELOXICAM 15 MG PO TABS: 15.0000 mg | ORAL_TABLET | Freq: Every day | ORAL | Status: DC

## 2014-09-02 NOTE — Progress Notes (Signed)
Subjective:  This chart was scribed for Tonye Pearson, MD by Charline Bills, ED Scribe. The patient was seen in room 9. Patient's care was started at 1:26 PM.   Patient ID: Bryan [name], male    DOB: 1975-11-11, 39 y.o.   MRN: [id number]  Chief Complaint  Patient presents with  . Generalized Body Aches    Arms and legs are hurting. x3 months   HPI  HPI Comments: Bryan [name] is a 39 y.o. male,  On no current medications and not identified with any current illnesses, who presents to the [facility] complaining of persistent L upper arm pain for the past 3 months. Pt works in heating and air. He states that he was lifting units the day before initial onset of pain 3 months ago.  The pain was mainly in the left arm and shoulder and over the course of a few weeks he became unable to lift the same amount with that arm as before. There was no tingling or numbness. There was no neck injury that preceded this. He still has trouble holding a coffee cup in this hand. Pain is exacerbated with lifting and movement.   over the past month he's also noticed pain in the right biceps and shoulder though not as severe as the left. This is also beginning to feel weak with any of his lifting at work.      Over the past week he has developed lumbar pain that radiates into L groin and down L leg. Back pain is exacerbated with movement and walking. He reports similar back pain in the past-- 5 years ago--  He was told was muscular and he received injections and pain medication  with relief/ he has been fine over the past fewyears until this urrent symptom.   Pt denies swelling, bruising, hearing a pop with lifting, neck pain.   Past Medical History  Diagnosis Date  . Chronic back pain    Current Outpatient Prescriptions on File Prior to Visit  Medication Sig Dispense Refill  . acetaminophen-codeine (TYLENOL #3) 300-30 MG per tablet Take 1-2 tablets by mouth  every 4 (four) hours as needed. (Patient not taking: Reported on 09/02/2014) 10 tablet 0  . amoxicillin (AMOXIL) 875 MG tablet Take 1 tablet (875 mg total) by mouth 2 (two) times daily. (Patient not taking: Reported on 09/02/2014) 20 tablet 0  . benzonatate (TESSALON) 200 MG capsule Take 1 capsule (200 mg total) by mouth 3 (three) times daily as needed for cough. (Patient not taking: Reported on 09/02/2014) 20 capsule 0  . HYDROcodone-homatropine (HYCODAN) 5-1.5 MG/5ML syrup Take 5 mLs by mouth every 4 (four) hours as needed for cough. (Patient not taking: Reported on 09/02/2014) 120 mL 0  . naproxen (NAPROSYN) 500 MG tablet Take 1 tablet (500 mg total) by mouth 2 (two) times daily with a meal. (Patient not taking: Reported on 09/02/2014) 30 tablet 0  . ofloxacin (OCUFLOX) 0.3 % ophthalmic solution Place 1 drop into the right eye every 4 (four) hours. (Patient not taking: Reported on 09/02/2014) 5 mL 0   No current facility-administered medications on file prior to visit.   No Known Allergies  Review of Systems  Musculoskeletal: Positive for myalgias and back pain. Negative for joint swelling, arthralgias and neck pain.  Skin: Negative for color change.  Neurological: Positive for weakness.   he was told at one point that he had hypertension and pleural medicines briefly but has  not been on medicines for the last several years.    He has no GI or GU symptoms  Objective:   Physical Exam  Constitutional: He is oriented to person, place, and time. He appears well-developed and well-nourished. No distress.  HENT:  Head: Normocephalic and atraumatic.  Eyes: Conjunctivae and EOM are normal.  Neck: Normal range of motion. Neck supple. No thyromegaly present.  Cardiovascular: Normal rate.   Pulmonary/Chest: Effort normal.  Musculoskeletal: Normal range of motion.  L shoulder has tenderness in the biceps tendon and supraspinatus with a good ROM and pain only with ABduction against resistance. There is no  periphersl motor or sensory loss and grip is good. There is tenderness at the insertion medially at the biceps of the elbow with a tender medical epicondyl but no swelling. R shoulder has a full ROM and the R biceps is tender laterally at insertion of the elbow without swelling. No weakness or sensory loss in this arm either. DTRs are 1+ and symmetrical. Tender over the lumbar sacral area generally. L straight leg raise to 90 degrees produces discomfort behind the knee. Hip ROM produces discomfort in the groin but no difference in ROM compared to the R. DTR are 1+ and symmetrical. No sensory or motor losses.  Neurological: He is alert and oriented to person, place, and time.  Skin: Skin is warm and dry.  Psychiatric: He has a normal mood and affect. His behavior is normal.  Nursing note and vitals reviewed.     Assessment & Plan:  Weakness of both arms - This is not obvious by exam as he has good grip strength bilaterally an good movement against resistance which is symmetrical  Biceps tendonitis of both shoulders-- minimal on the right and problems in the supraspinatus on the left but still fairly good movement against resistance  Pain in left elbow--- this is an injury to the distal biceps but there is no defect just tenderness  Bilateral low back pain with left-sided sciatica -  Radicular symptoms cannot really be precipitated by exam at this point  Hip pain, left -  This is minimal with exam but may represent early osteoarthritis  Obese abdomen - Plan: Lipid panel, POCT glucose (manual entry)  Results for orders placed or performed in visit on 09/02/14  POCT CBC  Result Value Ref Range   WBC 6.9 4.6 - 10.2 K/uL   Lymph, poc 3.4 0.6 - 3.4   POC LYMPH PERCENT 49.4 10 - 50 %L   MID (cbc) 0.4 0 - 0.9   POC MID % 5.9 0 - 12 %M   POC Granulocyte 3.1 2 - 6.9   Granulocyte percent 44.7 37 - 80 %G   RBC 4.87 4.69 - 6.13 M/uL   Hemoglobin 16.1 14.1 - 18.1 g/dL   HCT, POC 13.0 86.5 - 53.7 %     MCV 96.6 80 - 97 fL   MCH, POC 33.0 (A) 27 - 31.2 pg   MCHC 34.2 31.8 - 35.4 g/dL   RDW, POC 78.4 %   Platelet Count, POC 284 142 - 424 K/uL   MPV 7.3 0 - 99.8 fL  POCT SEDIMENTATION RATE  Result Value Ref Range   POCT SED RATE 2 0 - 22 mm/hr  POCT glucose (manual entry)  Result Value Ref Range   POC Glucose 88 70 - 99 mg/dl    If not responding to therapy over the next 3-4 weeks we obviously will have other interventions to do  I  have completed the patient encounter in its entirety as documented by the scribe, with editing by me where necessary. Heliodoro Domagalski P. Merla Richesoolittle, M.D.

## 2014-10-11 ENCOUNTER — Encounter: Payer: 59 | Admitting: Family Medicine

## 2014-12-30 ENCOUNTER — Ambulatory Visit (INDEPENDENT_AMBULATORY_CARE_PROVIDER_SITE_OTHER): Payer: 59 | Admitting: Physician Assistant

## 2014-12-30 VITALS — BP 128/86 | HR 81 | Temp 98.3°F | Resp 16 | Ht 69.5 in | Wt 239.0 lb

## 2014-12-30 DIAGNOSIS — R070 Pain in throat: Secondary | ICD-10-CM

## 2014-12-30 LAB — POCT RAPID STREP A (OFFICE): RAPID STREP A SCREEN: NEGATIVE

## 2014-12-30 NOTE — Patient Instructions (Addendum)
Please take ibuprofen 400mg  every 6 hours.  This could be viral at this time.  Please let us know if you are not feeling better within the next 7 days.

## 2014-12-30 NOTE — Progress Notes (Signed)
[facility] [address], [place] [id number] [phone]  Date:  12/30/2014   Name:  Bryan MoraleJuan Luis Dominguez [name]   DOB:  08-29-1975   MRN:  [id number]  PCP:  No PCP Per Patient    History of Present Illness:  Bryan MoraleJuan Luis Dominguez [name] is a 39 y.o. male patient who [facility] pain and ear pain for 3 days.  The throat pain is bilateral and aggravated with swallowing.  There is mild nasal congestion.  No fever.  There is no abdominal pain, nausea, vomiting, or diarrhea.    Patient Active Problem List   Diagnosis Date Noted  . Constipation 12/17/2011    Past Medical History  Diagnosis Date  . Chronic back pain     Past Surgical History  Procedure Laterality Date  . Appendectomy      History  Substance Use Topics  . Smoking status: Never Smoker   . Smokeless tobacco: Never Used  . Alcohol Use: No    History reviewed. No pertinent family history.  No Known Allergies  Medication list has been reviewed and updated.  Current Outpatient Prescriptions on File Prior to Visit  Medication Sig Dispense Refill  . cyclobenzaprine (FLEXERIL) 10 MG tablet Take 1 tablet (10 mg total) by mouth at bedtime. (Patient not taking: Reported on 12/30/2014) 30 tablet 0   No current facility-administered medications on file prior to visit.    ROS ROS otherwise unremarkable unless listed above.  Physical Examination: BP 128/86 mmHg  Pulse 81  Temp(Src) 98.3 F (36.8 C) (Oral)  Resp 16  Ht 5' 9.5" (1.765 m)  Wt 239 lb (108.41 kg)  BMI 34.80 kg/m2  SpO2 98% Ideal Body Weight: Weight in (lb) to have BMI = 25: 171.4  Physical Exam  Constitutional: He is oriented to person, place, and time. He appears well-developed and well-nourished. No distress.  HENT:  Head: Normocephalic and atraumatic.  Right Ear: Tympanic membrane, external ear and ear canal normal.  Left Ear: Tympanic membrane, external ear and ear canal normal.  Nose: Mucosal edema  and rhinorrhea present. Right sinus exhibits no maxillary sinus tenderness and no frontal sinus tenderness. Left sinus exhibits no maxillary sinus tenderness and no frontal sinus tenderness.  Mouth/Throat: Posterior oropharyngeal erythema present. No oropharyngeal exudate or posterior oropharyngeal edema.  Eyes: EOM are normal. Pupils are equal, round, and reactive to light. Right eye exhibits no discharge. Left eye exhibits no discharge.  Neck: Normal range of motion. Neck supple. No thyromegaly present.  Cardiovascular: Normal rate and regular rhythm.  Exam reveals no friction rub.   No murmur heard. Pulmonary/Chest: Effort normal and breath sounds normal. No respiratory distress. He has no wheezes.  Abdominal: Soft.  Neurological: He is alert and oriented to person, place, and time.  Skin: Skin is warm and dry. He is not diaphoretic.  Psychiatric: He has a normal mood and affect. His behavior is normal.   Results for orders placed or performed in visit on 12/30/14  Culture, Group A Strep  Result Value Ref Range   Organism ID, Bacteria STREPTOCOCCUS BETA HEMOLYTIC NOT GROUP A   POCT rapid strep A  Result Value Ref Range   Rapid Strep A Screen Negative Negative      Assessment and Plan: 39 year old male is here today for chief complaint of throat pain.  Likely viral upper respiratory infection.  Culture placed advised to return if symptoms do not resolve.   Throat pain -  Plan: POCT rapid strep A, Culture, Group A Strep   Trena Platt, PA-C [facility] 12/30/2014 8:45 PM  Advised of culture results, and will treat if he continues to have symptoms.    Marland Kitchen

## 2015-01-02 LAB — CULTURE, GROUP A STREP

## 2015-01-15 ENCOUNTER — Encounter: Payer: Self-pay | Admitting: Family Medicine

## 2015-01-26 ENCOUNTER — Encounter: Payer: Self-pay | Admitting: Physician Assistant

## 2015-02-17 ENCOUNTER — Ambulatory Visit (INDEPENDENT_AMBULATORY_CARE_PROVIDER_SITE_OTHER): Payer: 59 | Admitting: Physician Assistant

## 2015-02-17 VITALS — BP 120/78 | HR 75 | Temp 99.3°F | Resp 16 | Ht 69.0 in | Wt 237.4 lb

## 2015-02-17 DIAGNOSIS — M545 Low back pain: Secondary | ICD-10-CM

## 2015-02-17 DIAGNOSIS — G8929 Other chronic pain: Secondary | ICD-10-CM | POA: Diagnosis not present

## 2015-02-17 MED ORDER — TRAMADOL-ACETAMINOPHEN 37.5-325 MG PO TABS: 1.0000 | ORAL_TABLET | Freq: Three times a day (TID) | ORAL | Status: DC | PRN

## 2015-02-17 MED ORDER — PREDNISONE 20 MG PO TABS: 40.0000 mg | ORAL_TABLET | Freq: Every day | ORAL | Status: DC

## 2015-02-17 MED ORDER — MELOXICAM 15 MG PO TABS: 15.0000 mg | ORAL_TABLET | Freq: Every day | ORAL | Status: DC

## 2015-02-17 NOTE — Progress Notes (Signed)
02/19/2015 at 8:11 PM  [facility] Algis Lehenbauer / DOB: [birth year] / MRN: [id number]  The patient has Constipation on his problem list.  SUBJECTIVE  Chief complaint: Back Pain   This is not a new problem. Damontay Alred Sonny Anthes is a 39 y.o. complaining of stable "dull" low back pain that started 4 days ago. Associated symptoms include no other symptoms, and he denies weakness, numbness, tingling, dysuria, leg pain, perianal numbness.Treatments tried thus far include muscle relaxers with poor relief. He denies fever, nausea, dysuria, frequency and urgency.    He  has a past medical history of Chronic back pain.    Medications reviewed and updated by myself where necessary, and exist elsewhere in the encounter.   Mr. Lauren Aguayo has No Known Allergies. He  reports that he has never smoked. He has never used smokeless tobacco. He reports that he does not drink alcohol. He  has no sexual activity history on file. The patient  has past surgical history that includes Appendectomy.  His family history is not on file.  Review of Systems  Constitutional: Negative for fever and chills.  Respiratory: Negative for shortness of breath.   Cardiovascular: Negative for chest pain.  Gastrointestinal: Negative for nausea and abdominal pain.  Genitourinary: Negative.   Musculoskeletal: Positive for back pain. Negative for myalgias, falls and neck pain.  Skin: Negative for rash.  Neurological: Negative for dizziness and headaches.    OBJECTIVE  His  height is  (1.753 m) and weight is 237 lb 6 oz (107.673 kg). His oral temperature is 99.3 F (37.4 C). His blood pressure is 120/78 and his pulse is 75. His respiration is 16 and oxygen saturation is 97%.  The patient's body mass index is 35.04 kg/(m^2).  Physical Exam  Constitutional: He is oriented to person, place, and time. He appears well-developed.  HENT:  Head: Normocephalic.  Eyes: Pupils are equal, round, and reactive to  light.  Neck: Normal range of motion.  Cardiovascular: Normal rate.   Respiratory: Effort normal.  GI: Soft.  Musculoskeletal: Normal range of motion.  Neurological: He is alert and oriented to person, place, and time. He has normal strength. He displays normal reflexes. No cranial nerve deficit or sensory deficit. Gait abnormal. He displays no Babinski's sign on the right side. He displays no Babinski's sign on the left side.  Reflex Scores:      Patellar reflexes are 2+ on the right side and 2+ on the left side.      Achilles reflexes are 2+ on the right side and 2+ on the left side. Negative SLR  Skin: Skin is warm and dry.    No results found for this or any previous visit (from the past 24 hour(s)).  ASSESSMENT & PLAN  Fredy was seen today for back pain.  Diagnoses and all orders for this visit:  Acute exacerbation of chronic low back pain Orders: -     predniSONE (DELTASONE) 20 MG tablet; Take 2 tablets (40 mg total) by mouth daily with breakfast. -     meloxicam (MOBIC) 15 MG tablet; Take 1 tablet (15 mg total) by mouth daily. -     Ambulatory referral to Physical Therapy -     traMADol-acetaminophen (ULTRACET) 37.5-325 MG per tablet; Take 1-2 tablets by mouth every 8 (eight) hours as needed.    The patient was advised to call or come back to clinic if he does not see an improvement in symptoms, or worsens with  the above plan.   Deliah Boston, MHS, PA-C [facility] 02/19/2015 8:11 PM

## 2015-09-04 ENCOUNTER — Emergency Department (HOSPITAL_COMMUNITY)
Admission: EM | Admit: 2015-09-04 | Discharge: 2015-09-04 | Disposition: A | Discharge status: Home / Self Care | Payer: Commercial Managed Care - HMO | Attending: Emergency Medicine | Admitting: Emergency Medicine

## 2015-09-04 ENCOUNTER — Encounter (HOSPITAL_COMMUNITY): Payer: Self-pay | Admitting: Emergency Medicine

## 2015-09-04 DIAGNOSIS — Z79899 Other long term (current) drug therapy: Secondary | ICD-10-CM | POA: Insufficient documentation

## 2015-09-04 DIAGNOSIS — Z7952 Long term (current) use of systemic steroids: Secondary | ICD-10-CM | POA: Diagnosis not present

## 2015-09-04 DIAGNOSIS — G8929 Other chronic pain: Secondary | ICD-10-CM | POA: Insufficient documentation

## 2015-09-04 DIAGNOSIS — Z791 Long term (current) use of non-steroidal anti-inflammatories (NSAID): Secondary | ICD-10-CM | POA: Diagnosis not present

## 2015-09-04 DIAGNOSIS — M542 Cervicalgia: Secondary | ICD-10-CM | POA: Diagnosis not present

## 2015-09-04 DIAGNOSIS — M25511 Pain in right shoulder: Secondary | ICD-10-CM | POA: Diagnosis present

## 2015-09-04 MED ORDER — ORPHENADRINE CITRATE ER 100 MG PO TB12
100.0000 mg | ORAL_TABLET | Freq: Once | ORAL | Status: AC
  Administered 2015-09-04: 100 mg via ORAL
  Filled 2015-09-04: qty 1

## 2015-09-04 MED ORDER — ORPHENADRINE CITRATE ER 100 MG PO TB12: 100.0000 mg | ORAL_TABLET | Freq: Two times a day (BID) | ORAL | Status: AC

## 2015-09-04 MED ORDER — IBUPROFEN 400 MG PO TABS
600.0000 mg | ORAL_TABLET | Freq: Once | ORAL | Status: AC
  Administered 2015-09-04: 600 mg via ORAL
  Filled 2015-09-04: qty 1

## 2015-09-04 NOTE — ED Provider Notes (Signed)
CSN: [id number]     Arrival date & time 09/04/15  0734 History   First MD Initiated Contact with Patient 09/04/15 [phone]     Chief Complaint  Patient presents with  . Neck Pain  . Shoulder Pain  . Back Pain     (Consider location/radiation/quality/duration/timing/severity/associated sxs/prior Treatment) HPI   Bryan [name] w no sig PMH coming in with approx 1 month of R shoulder and neck pain. Patient does manual labor laying roofs and for the past month while at work he has had pain in his R shoulder and neck.  The pain is achy, worse with movement.  This morning he tried to move his neck and it was very severe pain on the R side and he had trouble ranging his neck so he didn't go to work.  He has had no falls.  No chest pain/sob.  He has been taking motrin intermittently for thye pain.  Past Medical History  Diagnosis Date  . Chronic back pain    Past Surgical History  Procedure Laterality Date  . Appendectomy     History reviewed. No pertinent family history. Social History  Substance Use Topics  . Smoking status: Never Smoker   . Smokeless tobacco: Never Used  . Alcohol Use: No    Review of Systems  Constitutional: Negative for fever and chills.  Eyes: Negative for redness.  Respiratory: Negative for cough and shortness of breath.   Cardiovascular: Negative for chest pain.  Gastrointestinal: Negative for nausea, vomiting, abdominal pain and diarrhea.  Genitourinary: Negative for dysuria.  Musculoskeletal: Positive for neck pain.  Skin: Negative for rash.  Neurological: Negative for headaches.  All other systems reviewed and are negative.     Allergies  Review of patient's allergies indicates no known allergies.  Home Medications   Prior to Admission medications   Medication Sig Start Date End Date Taking? Authorizing Provider  cyclobenzaprine (FLEXERIL) 10 MG tablet Take 1 tablet (10 mg total) by mouth at bedtime. 09/02/14   Tonye Pearson, MD  meloxicam (MOBIC) 15  MG tablet Take 1 tablet (15 mg total) by mouth daily. 02/17/15   Ofilia Neas, PA-C  orphenadrine (NORFLEX) 100 MG tablet Take 1 tablet (100 mg total) by mouth 2 (two) times daily. 09/04/15 09/10/15  Silas Flood, MD  predniSONE (DELTASONE) 20 MG tablet Take 2 tablets (40 mg total) by mouth daily with breakfast. 02/17/15   Ofilia Neas, PA-C  traMADol-acetaminophen (ULTRACET) 37.5-325 MG per tablet Take 1-2 tablets by mouth every 8 (eight) hours as needed. 02/17/15   Ofilia Neas, PA-C   BP 134/94 mmHg  Pulse 66  Temp(Src) 98.4 F (36.9 C) (Oral)  Resp 16  SpO2 99% Physical Exam  Constitutional: He is oriented to person, place, and time. No distress.  HENT:  Head: Normocephalic and atraumatic.  Eyes: EOM are normal. Pupils are equal, round, and reactive to light.  Neck: Normal range of motion. Neck supple.  Cardiovascular: Normal rate and intact distal pulses.   Pulmonary/Chest: Effort normal. No respiratory distress.  Abdominal: Soft. There is no tenderness.  Musculoskeletal: Normal range of motion.  ttp of the R trapezius and shoulder.  Pt is able to completely range his R shoulder.  R hand is nvi with full strength.  L hand is NVI with good strength.  Neurological: He is alert and oriented to person, place, and time.  Skin: No rash noted. He is not diaphoretic.  Psychiatric: He has a normal mood and  affect.    ED Course  Procedures (including critical care time) Labs Review Labs Reviewed - No data to display  Imaging Review No results found. I have personally reviewed and evaluated these images and lab results as part of my medical decision-making.   EKG Interpretation None      MDM   Final diagnoses:  Neck pain  Right shoulder pain    40 y [name] w no sig PMH coming in with approx 1 month of R shoulder and neck pain.  Exam as above.    This seems MSK.  The pain is easily reproducible.  No chest pain/sob, doubt cardiac etiology.  Able to range shoulder, doubt  septic joint.  No neuro sx in the R hand, doubt cord compression.  Good R radial pulse, doubt vascular cause.  Will try on norflex/ibu  I have discussed the results, Dx and Tx plan with the pt. They expressed understanding and agree with the plan and were told to return to ED with any worsening of condition or concern.    Disposition: Discharge  Condition: Good  New Prescriptions   ORPHENADRINE (NORFLEX) 100 MG TABLET    Take 1 tablet (100 mg total) by mouth 2 (two) times daily.    Follow Up: [facility] EMERGENCY DEPARTMENT [address] [id number] mc [place] [address] [phone]  As needed   Pt seen in conjunction with Dr. Harle Stanford, MD 09/04/15 [id number]  Tilden Fossa, MD 09/05/15 [phone]

## 2015-09-04 NOTE — ED Notes (Signed)
Pt to ER with complaint of right shoulder and neck pain x5 weeks. Worsened with movement. Denies any other symptoms. Works in [employer], states it's likely from heavy lifting. NAD. A/O x4.

## 2015-09-04 NOTE — Discharge Instructions (Signed)
Shoulder Pain The shoulder is the joint that connects your arm to your body. Muscles and band-like tissues that connect bones to muscles (tendons) hold the joint together. Shoulder pain is felt if an injury or medical problem affects one or more parts of the shoulder. HOME CARE   Put ice on the sore area.  Put ice in a plastic bag.  Place a towel between your skin and the bag.  Leave the ice on for 15-20 minutes, 03-04 times a day for the first 2 days.  Stop using cold packs if they do not help with the pain.  If you were given something to keep your shoulder from moving (sling; shoulder immobilizer), wear it as told. Only take it off to shower or bathe.  Move your arm as little as possible, but keep your hand moving to prevent puffiness (swelling).  Squeeze a soft ball or foam pad as much as possible to help prevent swelling.  Take medicine as told by your doctor. GET HELP IF:  You have progressing new pain in your arm, hand, or fingers.  Your hand or fingers get cold.  Your medicine does not help lessen your pain. GET HELP RIGHT AWAY IF:   Your arm, hand, or fingers are numb or tingling.  Your arm, hand, or fingers are puffy (swollen), painful, or turn white or blue. MAKE SURE YOU:   Understand these instructions.  Will watch your condition.  Will get help right away if you are not doing well or get worse.   This information is not intended to replace advice given to you by your health care provider. Make sure you discuss any questions you have with your health care provider.   Document Released: 12/29/2007 Document Revised: 08/02/2014 Document Reviewed: 11/04/2014 Elsevier Interactive Patient Education 2016 [employer]  

## 2015-10-03 ENCOUNTER — Ambulatory Visit (INDEPENDENT_AMBULATORY_CARE_PROVIDER_SITE_OTHER): Payer: Commercial Managed Care - HMO | Admitting: Family Medicine

## 2015-10-03 VITALS — BP 118/80 | HR 76 | Temp 98.3°F | Resp 18 | Ht 69.75 in | Wt 227.0 lb

## 2015-10-03 DIAGNOSIS — X503XXA Overexertion from repetitive movements, initial encounter: Secondary | ICD-10-CM

## 2015-10-03 DIAGNOSIS — T148XXA Other injury of unspecified body region, initial encounter: Principal | ICD-10-CM

## 2015-10-03 DIAGNOSIS — M546 Pain in thoracic spine: Secondary | ICD-10-CM | POA: Diagnosis not present

## 2015-10-03 DIAGNOSIS — R0789 Other chest pain: Secondary | ICD-10-CM

## 2015-10-03 MED ORDER — CYCLOBENZAPRINE HCL 5 MG PO TABS: ORAL_TABLET | ORAL | Status: DC

## 2015-10-03 NOTE — Patient Instructions (Signed)
Tome meloxicam cada dia, flexeril 5mg  3 veces cada dia si necesario, tylenol si necesario, y no trabaja por 3 dias.  si dolor no esta mejor en proximo semana a 10 dias - es posible referir a orthopedista.   regrese aqui o cuarto de emergencia si empeorse.   Lesiones por distensiones repetidas  (Repetitive Strain Injuries) Las lesiones por distensiones repetidas son el resultado del uso excesivo o mal uso de los tejidos [employer], tendones y nervios. Los tendones son estructuras similares a cordones que [employer] a los [place]. Estas lesiones pueden producirse en casi cualquier parte del cuerpo. Sin embargo, son ms frecuentes en los brazos (pulgares, [place], codos, hombros) y en las piernas (tobillos, rodillas). Los problemas mdicos causados por esfuerzos repetitivos son el sndrome del tnel carpiano, el codo de [place] o de [place], la bursitis y la tendinitis. Si se trata a tiempo y [employer] repetida se reduce o se elimina, la gravedad y la duracin de sus problemas por lo general disminuye. Este problema tambin se llama trastorno por trauma acumulativo.  CAUSAS  En muchos casos el problema se produce por la repeticin de la misma actividad en el trabajo durante semanas o meses sin descanso suficiente, como tipiar por un tiempo prolongado. Tambin puede ocurrir cuando se Therapist, occupational un hobby o un deporte repetidamente, sin descanso suficiente. Tambin puede ocurrir debido a la tensin o estrs constante sobre una parte del cuerpo en una persona que tiene uno o ms factores de riesgo de sufrir lesiones por movimientos repetitivos.  FACTORES DE RIESGO  Factores de riesgo en el lugar de trabajo  Frecuente uso de la computadora, especialmente si su escritorio no se ajusta a su cuerpo.  No descansar con frecuencia.  Trabajar en un ambiente con mucha presin.  Trabajar a un ritmo rpido.  Repetir el mismo movimiento, como tipiar con frecuencia.  Trabajar en una posicin  incmoda o mantener la misma posicin durante [place].  Hacer movimientos forzados Museum/gallery exhibitions officer, tirar o empujar.  La vibracin causada por el uso de herramientas elctricas.  Trabajar en temperaturas fras.  El estrs laboral. Factores de riesgo personales  Mala postura.  Tener las articulaciones flojas.  No hacer ejercicio regularmente.  Tener sobrepeso.  Artritis, diabetes, problemas de tiroides u otras enfermedades de larga duracin (crnicas).  Dficit de vitaminas.  Mantener las uas largas.  Un estilo de vida poco saludable, estresante o inactivo.  No dormir bien. SNTOMAS  Los sntomas suelen comenzar en el trabajo pero se notan ms despus de que la tensin repetida ha terminado. Por ejemplo, usted puede sentir fatiga o dolor en la [employer] se escribe en el Aleen Campi, y por la noche, usted puede desarrollar entumecimiento y hormigueo en los dedos. Los sntomas ms comunes son:   Dolor que arde, punza o dolor sordo, especialmente en los dedos, las palmas de las manos, las [place], antebrazos u hombros.  [place].  Hinchazn.  Hormigueo, adormecimiento o prdida de la sensibilidad.  Dolor al Solicitor actividades, como girar la perilla de una puerta o levantar el brazo por encima de su cabeza.  Debilidad, pesadez o prdida de la coordinacin en la mano.  Espasmos o rigidez muscular. En algunos casos, los sntomas pueden llegar a ser tan intensos que es difcil realizar las tareas diarias. Los sntomas que no mejoran con el reposo pueden indicar un trastorno ms grave.  DIAGNSTICO  El mdico puede determinar el tipo de lesin basndose en la evaluacin mdica y en  la descripcin de sus actividades.  TRATAMIENTO  El tratamiento depende de la gravedad y el tipo de lesin que sufre. El Firefightermdico le indicar reposo para la parte del cuerpo Pinewoodafectada, medicamentos y fisioterapia o terapia ocupacional para reducir Chief Technology Officerel dolor, la hinchazn y Chief Technology Officerel dolor.  Analice con su mdico las actividades repetitivas que realiza. Su mdico puede ayudarle a decidir si necesita modificar sus actividades. La lesin puede tardar meses o aos en curar, sobre todo si la parte afectada del cuerpo no reposa lo suficiente. En algunos casos, como en el sndrome del tnel carpiano grave, podrn recomendarle [place]una ciruga.  PREVENCIN   Boyd Kerbsonverse con su supervisor para asegurarse de que tiene el equipo adecuado en su lugar de McRaetrabajo.  Mantenga una buena postura en su escritorio o Environmental consultantlugar de trabajo:  Colocando los pies planos sobre el piso.  Las rodillas deben quedar Beazer Homesdirectamente sobre los pies, dobladas en ngulo recto.  La cintura debe estar bien apoyada en la silla o coloque un almohadn en la curva de la cintura.  Los hombros y los brazos deben estar relajados y a los lados.  El cuello relajado y sin inclinar hacia adelante o hacia atrs.  El escritorio y Immunologistel lugar en que trabaja con la computadora deben estar bien ajustados a su cuerpo.  Ajuste la silla de modo que no presione excesivamente la parte posterior de los muslos.  El teclado debe descansar sobre sus muslos. Debe ser capaz de llegar a las teclas con los codos a los lados, doblados en un ngulo recto. Los antebrazos deben estar descansando sobre apoyabrazos, y quedar paralelos al suelo.  Tener un fcil acceso al mouse.  El monitor debe quedar directamente frente a usted, con los ojos alineadas con la parte superior de la pantalla. La pantalla debe estar entre 15 y 25 centmetros de los ojos.  Al escribir, BB&T Corporationmantenga las muecas rectas, en una posicin neutral. Mueva el brazo entero cuando mueva el puntero del mouse o al tipiar teclas difciles de Baristaalcanzar.  Slo utilice la computadora cuando la necesite para Printmakertrabajar. No la use durante los perodos de descanso.  Tome descansos con frecuencia al Heritage managerrealizar cualquier actividad repetida. [place] con otras tareas que requieran el uso de diferentes msculos, o  descanse por lo menos una vez cada hora.  Cambie de posicin con regularidad. Si pasa mucho tiempo sentado, levntese, camine y estrese.  No sostener los lpices o los bolgrafos con fuerza al Programmer, applicationsescribir.  Haga ejercicios regularmente.  Mantenga un peso saludable.  Consuma una dieta con gran cantidad de vegetales, granos enteros y frutas.  Duerma lo suficiente. INSTRUCCIONES PARA EL CUIDADO EN EL HOGAR   Si su mdico le recet medicamentos para ayudar a reducir la hinchazn, tmelos como le indicaron.  Slo tome medicamentos de venta libre o recetados para Primary school teachercalmar el dolor, las molestias o bajar la fiebre segn las indicaciones de su mdico.  ZincReduzca, y si es necesario suspenda, las actividades que causan los problemas hasta que no tenga ningn otro sntoma. Si sus sntomas estn relacionados con Kathie Dikeel trabajo, es posible que necesite hablar con su supervisor acerca de cambiar sus Squaw Lakeactividades.  Cuando aparezcan los sntomas, aplique hielo o una compresa fra en la zona dolorida.  Ponga el hielo en una bolsa plstica.  Colquese una toalla entre la piel y la bolsa de hielo.  Deje el hielo en el lugar durante 15 a 20 minutos.  Si le Patent attorneyaconsejaron usar un cabestrillo para evitar que la [place] se doble, selo como  le indicaron. Es importante que use el cabestrillo durante la noche. selo todo el tiempo que el mdico se lo recomiende. SOLICITE ATENCIN MDICA SI:   Desarrolla nuevos sntomas.  El dolor no mejora con los medicamentos. ASEGRESE DE QUE:   Comprende estas instrucciones.  Controlar su enfermedad.  Solicitar ayuda de inmediato si no mejora o si empeora.   Esta informacin no tiene Theme park manager el consejo del mdico. Asegrese de hacerle al mdico cualquier pregunta que tenga.   Document Released: 10/28/2008 Document Revised: 01/11/2012 Elsevier Interactive Patient Education 2016 [employer]. Alzada y espasmos musculares  (Muscle Cramps and Spasms)  Los  calambres musculares y espasmos ocurren cuando un msculo o grupos de msculos se tensan y no se tiene control sobre esta tensin (contraccin muscular involuntaria). Es un problema comn y Software engineer en cualquier msculo. La zona ms comn son los msculos de la pantorrilla. Tanto los [employer] espasmos son contracciones musculares involuntarias, pero tambin tienen diferencias:   Los calambres musculares son espordicos y Engineer, mining. Pueden durar entre algunos segundos hasta un cuarto de [place]. Los calambres musculares son ms fuertes y duran ms que los espasmos musculares.  Los espasmos pueden o no ser dolorosos. Pueden durar algunos segundos o mucho ms. CAUSAS  No es frecuente que los calambres se deban a un trastorno subyacente grave. En muchos casos, la causa de los calambres y los espasmos es desconocida. Algunas causas frecuentes son:   Esfuerzo excesivo.   El uso excesivo del msculo por movimientos repetitivos (hacer lo mismo una y Laverda Page).   Permanecer en cierta posicin durante un largo perodo de [place].   Preparacin, forma o tcnica inadecuada al realizar un deporte o [place].   Deshidratacin.   Traumatismos.   Efectos secundarios de algunos medicamentos.  Niveles anormalmente bajos de las sales e iones en la sangre (electrolitos), especialmente el potasio y el calcio. Pueden ocurrir cuando se toman pldoras para Geographical information systems officer (diurticos) o en las mujeres embarazadas.  Algunos problemas mdicos subyacentes pueden hacer que sea ms propenso a desarrollar calambres o espasmos. Estos incluyen, pero no se limitan a:   Diabetes.   Enfermedad de Parkinson.   Trastornos hormonales, tales como problemas de la tiroides.   El consumo excesivo de alcohol.   Enfermedades especficas de [employer], las articulaciones y [place].   Enfermedad vascular en la que no llega suficiente sangre a los msculos.  INSTRUCCIONES PARA EL CUIDADO EN EL HOGAR    Mantngase bien hidratado. Beba gran cantidad de lquido para mantener la orina de tono claro o color amarillo plido.  Puede ser til Engineer, maintenance (IT), Therapist, music y International aid/development worker el msculo afectado.  Para los msculos tensos o apretados, use una toalla caliente, una almohadilla trmica o agua caliente de la ducha dirigida a la zona afectada.  Si est dolorido o siente dolor despus de un calambre o espasmo, aplique hielo en el rea afectada para Acupuncturist.  Ponga el hielo en una bolsa plstica.  Colquese una toalla entre la piel y la bolsa de hielo.  Deje el hielo en el lugar durante 15 a 20 minutos, 3 a 4 veces por da.  Los medicamentos que se utilizan para tratar las causas conocidas de los calambres o espasmos pueden reducir su frecuencia o gravedad. Tome slo medicamentos de venta libre o recetados, segn las indicaciones del mdico. SOLICITE ATENCIN MDICA SI:  Los calambres o espasmos empeoran, ocurren con ms frecuencia o no mejoran con Museum/gallery conservator.  ASEGRESE DE QUE:  Comprende estas instrucciones.  Controlar su enfermedad.  Solicitar ayuda de inmediato si no mejora o si empeora.   Esta informacin no tiene Theme park manager el consejo del mdico. Asegrese de hacerle al mdico cualquier pregunta que tenga.   Document Released: 04/21/2005 Document Revised: 11/06/2012 Elsevier Interactive Patient Education [employer]

## 2015-10-03 NOTE — Progress Notes (Signed)
Subjective:  By signing my name below, I, [name], attest that this documentation has been prepared under the direction and in the presence of Meredith Staggers, MD.  Electronically Signed: Andrew Au, ED Scribe. 10/03/2015. 6:06 PM.   Patient ID: Bryan [name], male    DOB: 05/02/1976, 40 y.o.   MRN: [id number]  HPI Chief Complaint  Patient presents with  . Arm Pain    underneath left arm since last Sunday.   . Neck Pain    left side of neck    HPI Comments: Bryan [name] is a 40 y.o. male who presents to the [facility] complaining of left chest wall pain. Was seen in the ED 2/9 for neck shoulder and back pain on the right side. 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 Thought to be MSK cause. Treated with norflex. Pt states right shoulder pain has gotten better but now has left chest wall pain. Pt states 6 days ago he was working on vents at work. The following day he had left chest wall pain and soreness. He was initially taking aleve but has been taking tylenol for the past 2 days. He's been taking meloxicam but this has not helped with the pain.  Pt works with Designer, television/film set. He has been work 6 days a week for the past year and a half. His next vacation is in April for 3 week. He did not work today and is off until Monday.   Patient Active Problem List   Diagnosis Date Noted  . Constipation 12/17/2011   Past Medical History  Diagnosis Date  . Chronic back pain    Past Surgical History  Procedure Laterality Date  . Appendectomy     No Known Allergies Prior to Admission medications   Medication Sig Start Date End Date Taking? Authorizing Provider  cyclobenzaprine (FLEXERIL) 10 MG tablet Take 1 tablet (10 mg total) by mouth at bedtime. Patient not taking: Reported on 10/03/2015 09/02/14   Tonye Pearson, MD  meloxicam (MOBIC) 15 MG tablet Take 1 tablet (15 mg total) by mouth daily. Patient not taking: Reported on  10/03/2015 02/17/15   Ofilia Neas, PA-C  predniSONE (DELTASONE) 20 MG tablet Take 2 tablets (40 mg total) by mouth daily with breakfast. Patient not taking: Reported on 10/03/2015 02/17/15   Ofilia Neas, PA-C  traMADol-acetaminophen (ULTRACET) 37.5-325 MG per tablet Take 1-2 tablets by mouth every 8 (eight) hours as needed. Patient not taking: Reported on 10/03/2015 02/17/15   Ofilia Neas, PA-C   Social History   Social History  . Marital Status: Single    Spouse Name: N/A  . Number of Children: N/A  . Years of Education: N/A   Occupational History  . Not on file.   Social History Main Topics  . Smoking status: Never Smoker   . Smokeless tobacco: Never Used  . Alcohol Use: No  . Drug Use: Not on file  . Sexual Activity: Not on file   Other Topics Concern  . Not on file   Social History Narrative   Review of Systems  Objective:   Physical Exam  Constitutional: He is oriented to person, place, and time. He appears well-developed and well-nourished. No distress.  HENT:  Head: Normocephalic and atraumatic.  Eyes: Conjunctivae and EOM are normal.  Neck: Neck supple.  Cardiovascular: Normal rate, regular rhythm and normal heart sounds.   No murmur heard. Pulmonary/Chest: Effort normal and  breath sounds normal. He has no wheezes. He has no rales.  Musculoskeletal: Normal range of motion.  Pain along left latissimus, left chest wall, a little into left pectoralis. No axillary tenderness or lymphadenopathy. He also has tenderness along left trapezius . Full cspine ROM. Strength intact.slight spasm on left upper trapezius. Lower paraspinal of neck. Normal pectoralis strength. No defect.  Lymphadenopathy:    He has no cervical adenopathy.  Neurological: He is alert and oriented to person, place, and time.  Reflex Scores:      Tricep reflexes are 2+ on the right side and 2+ on the left side.      Bicep reflexes are 2+ on the right side and 2+ on the left side.       Brachioradialis reflexes are 2+ on the right side and 2+ on the left side. Skin: Skin is warm and dry.  Psychiatric: He has a normal mood and affect. His behavior is normal.  Nursing note and vitals reviewed.  Filed Vitals:   10/03/15 1756  BP: 118/80  Pulse: 76  Temp: 98.3 F (36.8 C)  TempSrc: Oral  Resp: 18  Height: 5' 9.75" (1.772 m)  Weight: 227 lb (102.967 kg)  SpO2: 98%    Assessment & Plan:   Bryan MoraleJuan Luis Dominguez [name] is a 40 y.o. male Overuse syndrome - Plan: cyclobenzaprine (FLEXERIL) 5 MG tablet  Chest wall pain - Plan: cyclobenzaprine (FLEXERIL) 5 MG tablet  Right-sided thoracic back pain - Plan: cyclobenzaprine (FLEXERIL) 5 MG tablet  Suspected overuse syndrome with latissimus, chest wall discomfort on left. Previous trapezius and paraspinal pain on neck, improved, but still some present. No focal bony tenderness. No known injury. X-rays were deferred today.  -has meloxicam at home, advised to try restarting this, but needs to combine with relative rest for this to improve.  -flexeril 5 mg 3 times a day when necessary. Side effects discussed.  -If not improving next week, can refer to orthopedics, rtc sooner if worse.  Meds ordered this encounter  Medications  . cyclobenzaprine (FLEXERIL) 5 MG tablet    Sig: 1 pill by mouth up to every 8 hours as needed. Start with one pill by mouth each bedtime as needed due to sedation    Dispense:  15 tablet    Refill:  0    Label in spanish   Patient Instructions  Tome meloxicam cada dia, flexeril 5mg  3 veces cada dia si necesario, tylenol si necesario, y no trabaja por 3 dias.  si dolor no esta mejor en proximo semana a 10 dias - es posible referir a orthopedista.   regrese aqui o cuarto de emergencia si empeorse.   Lesiones por distensiones repetidas  (Repetitive Strain Injuries) Las lesiones por distensiones repetidas son el resultado del uso excesivo o mal uso de los tejidos Mirantblandos como los msculos, tendones y  nervios. Los tendones son estructuras similares a cordones que Automatic Dataunen los msculos a los [place] Freedomhuesos. Estas lesiones pueden producirse en casi cualquier parte del cuerpo. Sin embargo, son ms frecuentes en los brazos (pulgares, Columbianamuecas, codos, hombros) y en las piernas (tobillos, rodillas). Los problemas mdicos causados por esfuerzos repetitivos son el sndrome del tnel carpiano, el codo de Bulgariatenista o de Mitchellgolfista, la bursitis y la tendinitis. Si se trata a tiempo y Coventry Health Carela actividad repetida se reduce o se elimina, la gravedad y la duracin de sus problemas por lo general disminuye. Este problema tambin se llama trastorno por trauma acumulativo.  CAUSAS  En muchos casos el  problema se produce por la repeticin de la misma actividad en el trabajo durante semanas o meses sin descanso suficiente, como tipiar por un tiempo prolongado. Tambin puede ocurrir cuando se Therapist, occupational un hobby o un deporte repetidamente, sin descanso suficiente. Tambin puede ocurrir debido a la tensin o estrs constante sobre una parte del cuerpo en una persona que tiene uno o ms factores de riesgo de sufrir lesiones por movimientos repetitivos.  FACTORES DE RIESGO  Factores de riesgo en el lugar de trabajo  Frecuente uso de la computadora, especialmente si su escritorio no se ajusta a su cuerpo.  No descansar con frecuencia.  Trabajar en un ambiente con mucha presin.  Trabajar a un ritmo rpido.  Repetir el mismo movimiento, como tipiar con frecuencia.  Trabajar en una posicin incmoda o mantener la misma posicin durante [place].  Hacer movimientos forzados Museum/gallery exhibitions officer, tirar o empujar.  La vibracin causada por el uso de herramientas elctricas.  Trabajar en temperaturas fras.  El estrs laboral. Factores de riesgo personales  Mala postura.  Tener las articulaciones flojas.  No hacer ejercicio regularmente.  Tener sobrepeso.  Artritis, diabetes, problemas de tiroides u otras enfermedades de larga duracin  (crnicas).  Dficit de vitaminas.  Mantener las uas largas.  Un estilo de vida poco saludable, estresante o inactivo.  No dormir bien. SNTOMAS  Los sntomas suelen comenzar en el trabajo pero se notan ms despus de que la tensin repetida ha terminado. Por ejemplo, usted puede sentir fatiga o dolor en la [employer] se escribe en el [place]en [place]i, y por la noche, usted puede desarrollar entumecimiento y hormigueo en los dedos. Los sntomas ms comunes son:   Dolor que arde, punza o dolor sordo, especialmente en los dedos, las palmas de las manos, las [place], antebrazos u hombros.  [place].  Hinchazn.  Hormigueo, adormecimiento o prdida de la sensibilidad.  Dolor al Solicitor actividades, como girar la perilla de una puerta o levantar el brazo por encima de su cabeza.  Debilidad, pesadez o prdida de la coordinacin en la mano.  Espasmos o rigidez muscular. En algunos casos, los sntomas pueden llegar a ser tan intensos que es difcil realizar las tareas diarias. Los sntomas que no mejoran con el reposo pueden indicar un trastorno ms grave.  DIAGNSTICO  El mdico puede determinar el tipo de lesin basndose en la evaluacin mdica y en la descripcin de sus actividades.  TRATAMIENTO  El tratamiento depende de la gravedad y el tipo de lesin que sufre. El Firefighter reposo para la parte del cuerpo [place], medicamentos y fisioterapia o terapia ocupacional para reducir Chief Technology Officer, la hinchazn y Chief Technology Officer. Analice con su mdico las actividades repetitivas que realiza. Su mdico puede ayudarle a decidir si necesita modificar sus actividades. La lesin puede tardar meses o aos en curar, sobre todo si la parte afectada del cuerpo no reposa lo suficiente. En algunos casos, como en el sndrome del tnel carpiano grave, podrn recomendarle [place].  PREVENCIN   Boyd Kerbs con su supervisor para asegurarse de que tiene el equipo adecuado en su lugar de  [place].  Mantenga una buena postura en su escritorio o Environmental consultant de trabajo:  Colocando los pies planos sobre el piso.  Las rodillas deben quedar [employer], dobladas en ngulo recto.  La cintura debe estar bien apoyada en la silla o coloque un almohadn en la curva de la cintura.  Los hombros y los brazos deben estar relajados y a los lados.  El cuello relajado y sin inclinar hacia adelante o hacia atrs.  El escritorio y Immunologist en que trabaja con la computadora deben estar bien ajustados a su cuerpo.  Ajuste la silla de modo que no presione excesivamente la parte posterior de los muslos.  El teclado debe descansar sobre sus muslos. Debe ser capaz de llegar a las teclas con los codos a los lados, doblados en un ngulo recto. Los antebrazos deben estar descansando sobre apoyabrazos, y quedar paralelos al suelo.  Tener un fcil acceso al mouse.  El monitor debe quedar directamente frente a usted, con los ojos alineadas con la parte superior de la pantalla. La pantalla debe estar entre 15 y 25 centmetros de los ojos.  Al escribir, [employer] rectas, en una posicin neutral. Mueva el brazo entero cuando mueva el puntero del mouse o al tipiar teclas difciles de [place].  Slo utilice la computadora cuando la necesite para Printmaker. No la use durante los perodos de descanso.  Tome descansos con frecuencia al Heritage manager actividad repetida. [place] con otras tareas que requieran el uso de diferentes msculos, o descanse por lo menos una vez cada hora.  Cambie de posicin con regularidad. Si pasa mucho tiempo sentado, levntese, camine y estrese.  No sostener los lpices o los bolgrafos con fuerza al Programmer, applications.  Haga ejercicios regularmente.  Mantenga un peso saludable.  Consuma una dieta con gran cantidad de vegetales, granos enteros y frutas.  Duerma lo suficiente. INSTRUCCIONES PARA EL CUIDADO EN EL HOGAR   Si su mdico le recet  medicamentos para ayudar a reducir la hinchazn, tmelos como le indicaron.  Slo tome medicamentos de venta libre o recetados para Primary school teacher, las molestias o bajar la fiebre segn las indicaciones de su mdico.  [place], y si es necesario suspenda, las actividades que causan los problemas hasta que no tenga ningn otro sntoma. Si sus sntomas estn relacionados con Kathie Dike, es posible que necesite hablar con su supervisor acerca de cambiar sus [place].  Cuando aparezcan los sntomas, aplique hielo o una compresa fra en la zona dolorida.  Ponga el hielo en una bolsa plstica.  Colquese una toalla entre la piel y la bolsa de hielo.  Deje el hielo en el lugar durante 15 a 20 minutos.  Si le Patent attorney un cabestrillo para evitar que la [place] se doble, selo como le indicaron. Es importante que use el cabestrillo durante la noche. selo todo el tiempo que el mdico se lo recomiende. SOLICITE ATENCIN MDICA SI:   Desarrolla nuevos sntomas.  El dolor no mejora con los medicamentos. ASEGRESE DE QUE:   Comprende estas instrucciones.  Controlar su enfermedad.  Solicitar ayuda de inmediato si no mejora o si empeora.   Esta informacin no tiene Theme park manager el consejo del mdico. Asegrese de hacerle al mdico cualquier pregunta que tenga.   Document Released: 10/28/2008 Document Revised: 01/11/2012 Elsevier Interactive Patient Education 2016 [employer]. [place] y espasmos musculares  (Muscle Cramps and Spasms)  Los calambres musculares y espasmos ocurren cuando un msculo o grupos de msculos se tensan y no se tiene control sobre esta tensin (contraccin muscular involuntaria). Es un problema comn y Software engineer en cualquier msculo. La zona ms comn son los msculos de la pantorrilla. Tanto los [employer] espasmos son contracciones musculares involuntarias, pero tambin tienen diferencias:   Los calambres musculares son espordicos y  Engineer, mining. Pueden durar entre algunos segundos hasta un cuarto de [place]. Los calambres musculares son  ms fuertes y duran ms que los espasmos musculares.  Los espasmos pueden o no ser dolorosos. Pueden durar algunos segundos o mucho ms. CAUSAS  No es frecuente que los calambres se deban a un trastorno subyacente grave. En muchos casos, la causa de los calambres y los espasmos es desconocida. Algunas causas frecuentes son:   Esfuerzo excesivo.   El uso excesivo del msculo por movimientos repetitivos (hacer lo mismo una y Laverda Page).   Permanecer en cierta posicin durante un largo perodo de [place].   Preparacin, forma o tcnica inadecuada al realizar un deporte o [place].   Deshidratacin.   Traumatismos.   Efectos secundarios de algunos medicamentos.  Niveles anormalmente bajos de las sales e iones en la sangre (electrolitos), especialmente el potasio y el calcio. Pueden ocurrir cuando se toman pldoras para Geographical information systems officer (diurticos) o en las mujeres embarazadas.  Algunos problemas mdicos subyacentes pueden hacer que sea ms propenso a desarrollar calambres o espasmos. Estos incluyen, pero no se limitan a:   Diabetes.   Enfermedad de Parkinson.   Trastornos hormonales, tales como problemas de la tiroides.   El consumo excesivo de alcohol.   Enfermedades especficas de [employer], las articulaciones y [place].   Enfermedad vascular en la que no llega suficiente sangre a los msculos.  INSTRUCCIONES PARA EL CUIDADO EN EL HOGAR   Mantngase bien hidratado. Beba gran cantidad de lquido para mantener la orina de tono claro o color amarillo plido.  Puede ser til Engineer, maintenance (IT), Therapist, music y International aid/development worker el msculo afectado.  Para los msculos tensos o apretados, use una toalla caliente, una almohadilla trmica o agua caliente de la ducha dirigida a la zona afectada.  Si est dolorido o siente dolor despus de un calambre o espasmo, aplique hielo en el rea afectada para  Acupuncturist.  Ponga el hielo en una bolsa plstica.  Colquese una toalla entre la piel y la bolsa de hielo.  Deje el hielo en el lugar durante 15 a 20 minutos, 3 a 4 veces por da.  Los medicamentos que se utilizan para tratar las causas conocidas de los calambres o espasmos pueden reducir su frecuencia o gravedad. Tome slo medicamentos de venta libre o recetados, segn las indicaciones del mdico. SOLICITE ATENCIN MDICA SI:  Los calambres o espasmos empeoran, ocurren con ms frecuencia o no mejoran con Museum/gallery conservator.  ASEGRESE DE QUE:   Comprende estas instrucciones.  Controlar su enfermedad.  Solicitar ayuda de inmediato si no mejora o si empeora.   Esta informacin no tiene Theme park manager el consejo del mdico. Asegrese de hacerle al mdico cualquier pregunta que tenga.   Document Released: 04/21/2005 Document Revised: 11/06/2012 Elsevier Interactive Patient Education [employer]     I personally performed the services described in this documentation, which was scribed in my presence. The recorded information has been reviewed and considered, and addended by me as needed.

## 2015-10-04 ENCOUNTER — Ambulatory Visit (INDEPENDENT_AMBULATORY_CARE_PROVIDER_SITE_OTHER): Payer: Commercial Managed Care - HMO | Admitting: Family Medicine

## 2015-10-04 VITALS — BP 122/72 | HR 86 | Temp 97.5°F | Resp 17 | Ht 69.0 in | Wt 229.0 lb

## 2015-10-04 DIAGNOSIS — M25569 Pain in unspecified knee: Secondary | ICD-10-CM

## 2015-10-04 DIAGNOSIS — M25559 Pain in unspecified hip: Secondary | ICD-10-CM | POA: Diagnosis not present

## 2015-10-04 DIAGNOSIS — Z418 Encounter for other procedures for purposes other than remedying health state: Secondary | ICD-10-CM

## 2015-10-04 DIAGNOSIS — K5901 Slow transit constipation: Secondary | ICD-10-CM

## 2015-10-04 DIAGNOSIS — Z0000 Encounter for general adult medical examination without abnormal findings: Secondary | ICD-10-CM | POA: Diagnosis not present

## 2015-10-04 DIAGNOSIS — Z139 Encounter for screening, unspecified: Secondary | ICD-10-CM | POA: Diagnosis not present

## 2015-10-04 DIAGNOSIS — Z299 Encounter for prophylactic measures, unspecified: Secondary | ICD-10-CM

## 2015-10-04 DIAGNOSIS — K6289 Other specified diseases of anus and rectum: Secondary | ICD-10-CM | POA: Diagnosis not present

## 2015-10-04 LAB — LIPID PANEL
Cholesterol: 118 mg/dL — ABNORMAL LOW (ref 125–200)
HDL: 30 mg/dL — ABNORMAL LOW (ref >=40)
LDL Cholesterol: 53 mg/dL (ref <130)
Total CHOL/HDL Ratio: 3.9 Ratio (ref <=5.0)
Triglycerides: 176 mg/dL — ABNORMAL HIGH (ref <150)
VLDL: 35 mg/dL — ABNORMAL HIGH (ref <30)

## 2015-10-04 LAB — POCT URINALYSIS DIP (MANUAL ENTRY)
Bilirubin, UA: NEGATIVE
Blood, UA: NEGATIVE
Glucose, UA: NEGATIVE
Ketones, POC UA: NEGATIVE
Leukocytes, UA: NEGATIVE
Nitrite, UA: NEGATIVE
Protein Ur, POC: NEGATIVE
Spec Grav, UA: 1.02
Urobilinogen, UA: 0.2
pH, UA: 6

## 2015-10-04 LAB — POCT CBC
Granulocyte percent: 44.5 %G (ref 37–80)
HCT, POC: 43.7 % (ref 43.5–53.7)
Hemoglobin: 16 g/dL (ref 14.1–18.1)
Lymph, poc: 3.2 (ref 0.6–3.4)
MCH, POC: 34.7 pg — AB (ref 27–31.2)
MCHC: 36.6 g/dL — AB (ref 31.8–35.4)
MCV: 95 fL (ref 80–97)
MID (cbc): 3.2 — AB (ref 0–0.9)
MPV: 6.8 fL (ref 0–99.8)
POC Granulocyte: 0.4 — AB (ref 2–6.9)
POC LYMPH PERCENT: 49.9 %L (ref 10–50)
POC MID %: 5.6 %M (ref 0–12)
Platelet Count, POC: 216 10*3/uL (ref 142–424)
RBC: 4.6 M/uL — AB (ref 4.69–6.13)
RDW, POC: 12.4 %
WBC: 6.5 10*3/uL (ref 4.6–10.2)

## 2015-10-04 LAB — COMPLETE METABOLIC PANEL WITH GFR
ALT: 36 U/L (ref 9–46)
AST: 32 U/L (ref 10–40)
Albumin: 4.6 g/dL (ref 3.6–5.1)
Alkaline Phosphatase: 74 U/L (ref 40–115)
BUN: 12 mg/dL (ref 7–25)
CO2: 28 mmol/L (ref 20–31)
Calcium: 9.4 mg/dL (ref 8.6–10.3)
Chloride: 100 mmol/L (ref 98–110)
Creat: 0.85 mg/dL (ref 0.60–1.35)
GFR, Est African American: >89 mL/min (ref >=60)
GFR, Est Non African American: >89 mL/min (ref >=60)
Glucose, Bld: 99 mg/dL (ref 65–99)
Potassium: 4.5 mmol/L (ref 3.5–5.3)
Sodium: 138 mmol/L (ref 135–146)
Total Bilirubin: 1.2 mg/dL (ref 0.2–1.2)
Total Protein: 7.3 g/dL (ref 6.1–8.1)

## 2015-10-04 LAB — TSH: TSH: 0.96 mIU/L (ref 0.40–4.50)

## 2015-10-04 LAB — POCT GLYCOSYLATED HEMOGLOBIN (HGB A1C): Hemoglobin A1C: 5.2

## 2015-10-04 LAB — POCT SEDIMENTATION RATE: POCT SED RATE: 4 mm/hr (ref 0–22)

## 2015-10-04 MED ORDER — PREDNISONE 20 MG PO TABS: 20.0000 mg | ORAL_TABLET | Freq: Every day | ORAL | Status: DC

## 2015-10-04 MED ORDER — DOCUSATE SODIUM 100 MG PO CAPS: 100.0000 mg | ORAL_CAPSULE | Freq: Every day | ORAL | Status: DC | PRN

## 2015-10-04 NOTE — Progress Notes (Addendum)
   10/04/2015 10:55 AM   DOB: 05-09-1976 / MRN: [id number]  SUBJECTIVE:  Bryan [name] is a 40 y.o. male presenting for an annual physical.  He has no complaints today. He is a never smoker and denies EtOH and illicit drug use.    There is no immunization history on file for this patient.   He has No Known Allergies.   He  has a past medical history of Chronic back pain.    He  reports that he has never smoked. He has never used smokeless tobacco. He reports that he does not drink alcohol or use illicit drugs. He  reports that he does not engage in sexual activity. The patient  has past surgical history that includes Appendectomy.  His family history includes Cancer in his mother; Diabetes in his father.  Review of Systems  Constitutional: Negative.   HENT: Negative.   Eyes: Negative.   Respiratory: Negative.   Cardiovascular: Negative.   Gastrointestinal: Negative.   Skin: Negative.   Neurological: Negative.     Problem list and medications reviewed and updated by myself where necessary, and exist elsewhere in the encounter.   OBJECTIVE:  BP 122/72 mmHg  Pulse 86  Temp(Src) 97.5 F (36.4 C) (Oral)  Resp 17  Ht 5\' 9"  (1.753 m)  Wt 229 lb (103.874 kg)  BMI 33.80 kg/m2  SpO2 99%  Physical Exam  Constitutional: He is oriented to person, place, and time. No distress.  Cardiovascular: Normal rate.   Pulmonary/Chest: Effort normal.  Neurological: He is alert and oriented to person, place, and time.  Vitals reviewed.   No results found for this or any previous visit (from the past 72 hour(s)).  No results found.   Visual Acuity Screening   Right eye Left eye Both eyes  Without correction: 20/20 20/20 20/20   With correction:      ASSESSMENT AND PLAN  Bryan [name] was seen today for annual exam.  Diagnoses and all orders for this visit:  Annual physical exam  Screening -     POCT glycosylated hemoglobin (Hb A1C) -     POCT CBC -     COMPLETE METABOLIC  PANEL WITH GFR -     HIV antibody -     Lipid panel  Need for prophylactic measure -     Tdap vaccine greater than or equal to 7yo IM    The patient was advised to call or return to clinic if he does not see an improvement in symptoms or to seek the care of the closest emergency department if he worsens with the above plan.   [facility] 10/04/2015 10:55 AM

## 2015-10-04 NOTE — Addendum Note (Signed)
Addended by: Deliah BostonLARK, MICHAEL L on: 10/04/2015 07:00 PM   Modules accepted: SmartSet

## 2015-10-04 NOTE — Patient Instructions (Signed)
Mantenimiento de Technical sales engineer - Hombres (Health Maintenance, Male) Un estilo de vida saludable y los cuidados preventivos pueden favorecer la salud y [place].  No deje de [employer] de rutina de la salud, dentales y de Public librarian.  Consuma una dieta saludable. Los [employer], frutas, cereales integrales, productos lcteos descremados y protenas magras contienen los nutrientes que usted necesita y no tienen muchas caloras. Disminuya la ingesta de alimentos ricos en grasas slidas, azcares y sal agregadas. Si es necesario, pdale informacin acerca de una dieta [place] a su mdico.  Realizar actividad fsica de forma regular es una de las prcticas ms importantes que puede hacer por su salud. Los adultos deben hacer al menos 150 minutos de ejercicios de intensidad moderada (cualquier actividad que aumente la frecuencia cardaca y lo haga transpirar) cada semana. Adems, la [employer] de los adultos necesita practicar ejercicios de fortalecimiento muscular dos o ms veces por semana.  Mantenga un peso saludable. El ndice de masa corporal [facility]) es una herramienta que identifica posibles problemas con [place]. Proporciona una estimacin de la grasa corporal basndose en el peso y la altura. El mdico podr determinar su [facility] y ayudarlo a Scientist, forensic o Theatre manager un peso saludable. Para los adultos mayores de 20aos:  Un [facility] menor de 18,5 se considera bajo peso.  Un [facility] entre 18,5 y 24,9 es normal.  Un [facility] entre 25 y 29,9 se considera sobrepeso.  Un I[facility] de 30 o ms se considera obesidad.  Mantenga un nivel normal de lpidos y colesterol en la sangre practicando actividad fsica y minimizando la ingesta de grasas saturadas. Consuma una dieta balanceada e incluya variedad de frutas y vegetales. A partir de los 20 aos se deben realizar anlisis de sangre a fin de Freight forwarder nivel de lpidos y colesterol en la sangre y [place] cada 5 aos. Si los niveles de colesterol son altos,  tiene ms de 50 aos o tiene riesgo elevado de sufrir enfermedades cardacas, Designer, industrial/product controlarse con ms frecuencia. Si tiene [employer] de lpidos y colesterol, debe recibir tratamiento con medicamentos, si la dieta y el ejercicio no estn funcionando.  Si fuma, consulte con el mdico acerca de las opciones para dejar de [place]. Si no fuma, no comience.  Se recomienda realizar exmenes de deteccin de cncer de pulmn a personas adultas entre 74 y 90 aos que estn en riesgo de Horticulturist, commercial de pulmn por sus antecedentes de consumo de tabaco. Para quienes hayan fumado durante 30 aos un paquete diario, y sigan fumando o hayan dejado el hbito en algn momento en los ltimos 15 aos, se recomienda realizarse una tomografa computada de baja dosis de los pulmones todos los [place]. Fumar un paquete por ao equivale a fumar un promedio de un paquete de cigarrillos diario durante un ao (por ejemplo, fumar 30paquetes por ao podra significar fumar un paquete de cigarrillos diario durante 30aos o 2paquetes diarios durante 15aos). Los exmenes anuales deben continuar hasta que el fumador haya dejado de fumar durante un mnimo de 15 aos. Ya no deben Emergency planning/management officer que tengan un problema de salud que les impida recibir tratamiento para el cncer de pulmn.  Si decide tomar alcohol, no beba ms de [employer]. Se considera una medida 12onzas (372m) de cerveza, 5onzas (1551m de vino o 1,[id number] licor.  Evite el consumo de drogas. No comparta agujas. Pida ayuda si necesita asistencia o instrucciones con respecto a abandonar el consumo de drogas.  La  hipertensin arterial causa enfermedades cardacas y aumenta el riesgo de ictus. La hipertensin arterial es ms probable en los siguientes casos:  Las personas que tienen la presin arterial en el extremo del rango normal (100-139/85-89 mm Hg).  Las personas con sobrepeso u obesidad.  Las personas  afroamericanas.  Si usted tiene entre 18 y 39 aos, debe medirse la presin arterial cada 3 a 5 aos. Si usted tiene 40 aos o ms, debe medirse la presin arterial todos los aos. Debe medirse la presin arterial dos veces: una vez cuando est en un hospital o una clnica y la otra vez cuando est en otro sitio. Registre el promedio de las dos mediciones. Para controlar su presin arterial cuando no est en un hospital o una clnica, puede usar lo siguiente:  Una mquina automtica para medir la presin arterial en una farmacia.  Un monitor para medir la presin arterial en el hogar.  Si tiene entre 45 y 79 aos, consulte a su mdico si debe tomar aspirina para prevenir enfermedades cardacas.  Los anlisis para la diabetes incluyen la toma de una muestra de sangre para controlar el nivel de azcar en la sangre durante el ayuno. Debe hacerlos cada 3aos despus de los 45aos si su peso es normal y no tiene factores de riesgo de diabetes. Las pruebas deben comenzar a edades tempranas o llevarse a cabo con ms frecuencia si tiene sobrepeso y al menos un factor de riesgo para la diabetes.  El cncer colorrectal puede detectarse y con frecuencia puede prevenirse. La mayor parte de los estudios de rutina se deben comenzar a hacer a partir de los 50 aos y hasta los 75 aos. Sin embargo, el mdico podr aconsejarle que lo haga antes, si tiene factores de riesgo para el cncer de colon. Una vez por ao, el mdico le dar un kit de prueba para hallar sangre oculta en la materia fecal. Es posible que se use una pequea cmara en el extremo de un tubo para examinar directamente el colon (sigmoidoscopa o colonoscopa) para detectar formas tempranas de cncer colorrectal. Hable sobre esto con su mdico si tiene 50 aos, edad a la que comienzan los estudios de rutina. El examen directo del colon debe repetirse cada cinco a 10 aos, hasta los 75 aos, excepto que se encuentren formas tempranas de plipos  precancerosos o pequeos bultos.  Las personas con un riesgo mayor de padecer hepatitis B deben realizarse anlisis para detectar el virus. Se considera que tiene un alto riesgo de contraer hepatitis B si:  Naci en un pas donde la hepatitis B es frecuente. Pregntele a su mdico qu pases son considerados de alto riesgo.  Sus padres nacieron en un pas de alto riesgo y usted no recibi una vacuna que lo proteja contra la hepatitis B (vacuna contra la hepatitis B).  Tiene VIH o sida.  Usa agujas para inyectarse drogas.  Vive o tiene sexo con alguien que tiene hepatitis B.  Es un hombre que tiene sexo con otros hombres.  Recibe tratamiento de hemodilisis.  Toma ciertos medicamentos para enfermedades como cncer, trasplante de rganos y afecciones autoinmunes.  Se recomienda realizar un anlisis de sangre para detectar hepatitis C a todas las personas nacidas entre 1945 y 1965, y a toda persona que tenga un riesgo de haber contrado esta enfermedad.  Los hombres sanos no deben hacerse anlisis de sangre para detectar antgenos especficos prostticos (PSA) como parte de los estudios de rutina para el cncer. Pregntele a su mdico sobre   las pruebas de Programme researcher, broadcasting/film/video de cncer de prstata.  La evaluacin del cncer de testculos no se recomienda en hombres adolescentes ni adultos que no tengan sntomas. La evaluacin incluye el autoexamen, el examen por parte del mdico y otras pruebas diagnsticas. Consulte con su mdico si tiene algn sntoma o preocupaciones acerca del cncer de testculos.  Practique el sexo seguro. Use condones y evite las prcticas sexuales riesgosas para disminuir el contagio de enfermedades de transmisin sexual (ETS).  Debe realizarse pruebas de deteccin de ETS, incluidas la gonorrea y la clamidia si:  Es sexualmente activa y es menor de [place].  Es mayor de 57aos y Investment banker, operational dice que est en riesgo de padecer esta infeccin.  La actividad sexual ha  cambiado desde que le hicieron la ltima prueba de deteccin y tiene un riesgo mayor de Best boy clamidia o Radio broadcast assistant. Pregntele al mdico si usted tiene riesgo.  Si tiene riesgo de infectarse por el VIH, se recomienda tomar diariamente un medicamento recetado para evitar la infeccin. Esto se conoce como profilaxis previa a la exposicin. Se considera que est en riesgo si:  Es un hombre que tiene sexo con otros hombres.  Es heterosexual y es activo sexualmente con mltiples parejas.  Se inyecta drogas.  Es sexualmente activo con una pareja que tiene VIH.  Consulte a su mdico para saber si tiene un alto riesgo de infectarse por el VIH. Si opta por comenzar la profilaxis previa a la exposicin, primero debe realizarse anlisis de deteccin del VIH. Luego, le harn anlisis cada 16mses mientras est tomando los medicamentos para la profilaxis previa a la exposicin.  Utilice pantalla solar. Aplique pantalla solar de mKerry Doryy repetida a lo largo del dTraining and development officer Resgurdese del sol cuando la sombra sea ms pequea que usted. Protjase usando mangas y pThe [employer] un sombrero de ala ancha y gafas para el sol todo el ao, siempre que se encuentre en el exterior.  Informe a su mdico si aparecen nuevos lunares o los que tiene se modifican, especialmente en forma y color. Tambin notifique al mdico si un lunar es ms grande que el tamao de una goma de lGames developer  Si tiene entre 660y 779aos y es o ha sido fumador, se recomienda un estudio con ecografa para dEnvironmental managerde aorta abdominal (AAA) y su eventual reparacin qUnited [place]  [place](inmunizaciones).   Esta informacin no tiene cMarine scientistel consejo del mdico. Asegrese de hacerle al mdico cualquier pregunta que tenga.   Document Released: 01/08/2008 Document Revised: 08/02/2014 Elsevier Interactive Patient Education 2[employer]

## 2015-10-06 LAB — HIV ANTIBODY (ROUTINE TESTING W REFLEX): HIV 1&2 Ab, 4th Generation: NONREACTIVE

## 2015-10-06 LAB — VITAMIN D 25 HYDROXY (VIT D DEFICIENCY, FRACTURES): Vit D, 25-Hydroxy: 20 ng/mL — ABNORMAL LOW (ref 30–100)

## 2015-12-27 ENCOUNTER — Ambulatory Visit (INDEPENDENT_AMBULATORY_CARE_PROVIDER_SITE_OTHER): Payer: Commercial Managed Care - HMO | Admitting: Family Medicine

## 2015-12-27 ENCOUNTER — Ambulatory Visit (INDEPENDENT_AMBULATORY_CARE_PROVIDER_SITE_OTHER): Payer: Commercial Managed Care - HMO

## 2015-12-27 VITALS — BP 130/90 | HR 79 | Temp 98.0°F | Resp 16 | Ht 69.25 in | Wt 235.2 lb

## 2015-12-27 DIAGNOSIS — R042 Hemoptysis: Secondary | ICD-10-CM | POA: Diagnosis not present

## 2015-12-27 DIAGNOSIS — M25562 Pain in left knee: Secondary | ICD-10-CM

## 2015-12-27 DIAGNOSIS — S838X2A Sprain of other specified parts of left knee, initial encounter: Secondary | ICD-10-CM

## 2015-12-27 DIAGNOSIS — H65192 Other acute nonsuppurative otitis media, left ear: Secondary | ICD-10-CM

## 2015-12-27 DIAGNOSIS — S8392XA Sprain of unspecified site of left knee, initial encounter: Secondary | ICD-10-CM

## 2015-12-27 DIAGNOSIS — M722 Plantar fascial fibromatosis: Secondary | ICD-10-CM

## 2015-12-27 MED ORDER — HYDROCODONE-HOMATROPINE 5-1.5 MG/5ML PO SYRP: 5.0000 mL | ORAL_SOLUTION | Freq: Three times a day (TID) | ORAL | Status: DC | PRN

## 2015-12-27 MED ORDER — CYCLOBENZAPRINE HCL 5 MG PO TABS: 5.0000 mg | ORAL_TABLET | Freq: Three times a day (TID) | ORAL | Status: DC | PRN

## 2015-12-27 MED ORDER — MELOXICAM 15 MG PO TABS: 15.0000 mg | ORAL_TABLET | Freq: Every day | ORAL | Status: DC

## 2015-12-27 MED ORDER — AMOXICILLIN-POT CLAVULANATE 875-125 MG PO TABS: 1.0000 | ORAL_TABLET | Freq: Two times a day (BID) | ORAL | Status: DC

## 2015-12-27 MED ORDER — BENZONATATE 200 MG PO CAPS: 200.0000 mg | ORAL_CAPSULE | Freq: Three times a day (TID) | ORAL | Status: DC | PRN

## 2015-12-27 MED ORDER — OMEPRAZOLE 40 MG PO CPDR: 40.0000 mg | DELAYED_RELEASE_CAPSULE | Freq: Every day | ORAL | Status: DC

## 2015-12-27 NOTE — Patient Instructions (Addendum)
Do not use the meloxicam with any other otc pain medication other than tylenol/acetaminophen - so no aleve, ibuprofen, motrin, advil, etc.   Meds ordered this encounter  Medications  . omeprazole (PRILOSEC) 40 MG capsule    Sig: Take 1 capsule (40 mg total) by mouth daily. 30 minutes before a meal    Dispense:  90 capsule    Refill:  0  . meloxicam (MOBIC) 15 MG tablet    Sig: Take 1 tablet (15 mg total) by mouth daily.    Dispense:  30 tablet    Refill:  1    Do not use with any other otc pain medication other than tylenol/acetaminophen - so no aleve, ibuprofen, motrin, advil, etc.  . amoxicillin-clavulanate (AUGMENTIN) 875-125 MG tablet    Sig: Take 1 tablet by mouth 2 (two) times daily.    Dispense:  20 tablet    Refill:  0  . HYDROcodone-homatropine (HYCODAN) 5-1.5 MG/5ML syrup    Sig: Take 5 mLs by mouth every 8 (eight) hours as needed for cough.    Dispense:  120 mL    Refill:  0  . benzonatate (TESSALON) 200 MG capsule    Sig: Take 1 capsule (200 mg total) by mouth 3 (three) times daily as needed for cough.    Dispense:  40 capsule    Refill:  0     IF you received an x-ray today, you will receive an invoice from [facility]. Please contact [facility] at [phone] with questions or concerns regarding your invoice.   IF you received labwork today, you will receive an invoice from [employer]. Please contact [facility] at [phone] with questions or concerns regarding your invoice.   Our billing staff will not be able to assist you with questions regarding bills from these companies.  You will be contacted with the lab results as soon as they are available. The fastest way to get your results is to activate your My Chart account. Instructions are located on the last page of this paperwork. If you have not heard from [place] regarding the results in 2 weeks, please contact this office.   Fascitis plantar con rehabilitacin (Plantar  Fasciitis With Rehab) La fascia plantar es una estructura fibrosa, tipo ligamento, de tejido blando que abarca la parte inferior del pie. La fascitis plantar es una enfermedad que ocasiona dolor en el pie debido a la inflamacin del tejido. SNTOMAS  Dolor y sensibilidad en la planta del pie.  Dolor especialmente al ponerse de pie o caminar. CAUSAS La fascitis plantar est causada por irritacin y lesin en la fascia plantar debajo del pie. Los mecanismos ms frecuentes de una lesin son:  Golpe directo en la planta del pie.  Dao a un pequeo nervio que [employer], Medical laboratory scientific officer en que la fascia principal se une al hueso del taln.  Estrs aplicado en la fascia plantar debido a espolones seos. EL RIESGO AUMENTA CON:   Actividades que estresan la fascia plantar (correr, saltar, pivotar o cortar).  Poca fuerza y flexibilidad.  Calzado mal ajustado.  Msculos de la pantorrilla tensos.  Pie plano.  No hacer un precalentamiento adecuado.  Obesidad. PREVENCIN  Precalentamiento adecuado y elongacin antes de la [place].  Descanso y recuperacin entre actividades.  Mantener la forma fsica:  Earma Reading, flexibilidad y resistencia muscular.  Capacidad cardiovascular.  Mantenga un peso corporal adecuado.  Evite el estrs en la fascia plantar.  Para deportistas con pie plano, utilizacin de plantillas  anatmicas para los arcos. PRONSTICO Si se trata adecuadamente, generalmente es curable sin [place]. En ocasiones requiere someterse a [place] ciruga. POSIBLES COMPLICACIONES  La recurrencia frecuente de los sntomas puede dar como resultado un problema crnico.  Problemas en la cintura causados para compensar la lesin, como renguera.  Dolor o debilidad en el pie al adelantar el pie luego de la [place].  Inflamacin crnica, cicatrizacin y ruptura parcial o completa de la fascia, que se produce luego de repetidas inyecciones. TRATAMIENTO El tratamiento inicial  incluye el uso de medicamentos y la aplicacin de hielo para reducir Chief Technology Officerel dolor y la inflamacin. Los ejercicios de elongacin y fortalecimiento pueden ayudar a reducir Chief Technology Officerel dolor con la actividad, en especial los dirigidos al tendn de [place]. Los ejercicios pueden Management consultantrealizarse en el hogar o con un terapeuta. El Office Depotmdico le podr recomendar que utilice tacos o soportes para el arco para ayudar a Museum/gallery exhibitions officerreducir el estrs de la fascia plantar. En algunos casos se indica una inyeccin de corticoides para reducir la inflamacin. Si los sntomas persisten por ms de 6 meses de tratamiento no quirrgico (conservador), se Public relations account executiveindicar la ciruga.  MEDICAMENTOS  Si necesita analgsicos, se recomiendan los antiinflamatorios no esteroides, como aspirina e ibuprofeno y otros calmantes menores, como acetaminofeno  No tome medicamentos para Chief Technology Officerel dolor dentro de los [address] das previos a la [place].  Los analgsicos prescriptos se indicarn si el mdico lo considera necesario. Utilcelos como se le indique y slo cuando lo necesite.  En algunos casos se indica una inyeccin de corticosteroides. Estas inyecciones deben reservarse para los [place] graves, porque slo se pueden administrar una determinada cantidad de veces. CALOR Y FRO  El tratamiento con fro EchoStaralivia el dolor y reduce la inflamacin. El fro debe aplicarse durante 10 a 15 minutos cada 2  3 horas para reducir la inflamacin y Chief Technology Officerel dolor e inmediatamente despus de cualquier actividad que agrava los sntomas. Utilice bolsas de hielo o masajee la zona con un trozo de hielo (masaje de hielo).  El calor puede usarse antes de Therapist, musicelongar y de las actividades de fortalecimiento indicadas por el profesional, le fisioterapeuta o Orthoptistel entrenador. Utilice una bolsa trmica o sumerja la lesin en agua caliente. SOLICITE ATENCIN MDICA DE INMEDIATO SI:  El tratamiento no lo beneficia, o el trastorno empeora.  Los medicamentos producen efectos secundarios. EJERCICIOS EJERCICIOS DE AMPLITUD  DE MOVIMIENTOS Y ELONGACIN - Fascitis plantar (sndrome del espoln en el taln) Estos ejercicios le ayudarn en la recuperacin de la lesin. Los sntomas podrn aliviarse con o sin asistencia adicional de su mdico, fisioterapeuta o Herbalistentrenador. Al completar estos ejercicios, recuerde:   Restaurar la flexibilidad del tejido ayuda a que las articulaciones recuperen el movimiento normal. Esto permite que el movimiento y la actividad sea ms saludables y menos dolorosos.  Para que sea efectiva, cada elongacin debe realizarse durante al menos 30 segundos.  La elongacin nunca debe ser dolorosa. Deber sentir slo un alargamiento o distensin suave del tejido que estira. AMPLITUD DE MOVIMIENTOS - Extensin de los dedos - flexin  Sintese con la pierna derecha / izquierdo cruzada sobre la rodilla opuesta.  CenterPoint Energyome los dedos de los pies y empjelos hacia usted. Debe sentir un estiramiento suave en la zona inferior de los dedos y del pie.  Mantenga esta posicin durante __________ segundos.  Luego, tome los dedos de los pies y empjelos [place] abajo. Debe sentir un estiramiento suave en la zona superior de los dedos y del pie.  Mantenga esta posicin durante __________ segundos.  Reptalo __________ veces. Realice este estiramiento __________ Anthoney Harada por da.  AMPLITUD DE MOVIMIENTOS - Dorsiflexin del tobillo - Beverly Milch, asistida  Qutese los zapatos y sintese en una silla, preferiblemente en una superficie sin alfombra.  Coloque el pie derecha / izquierdo debajo de la rodilla. Extienda la pierna contraria para estar apoyado.  Con el taln hacia abajo, deslice el pie derecha / izquierdo hacia la silla hasta que sienta un estiramiento en el tobillo o pantorrilla. Si no lo siente, deslice la cadera hacia adelante [employer] borde de la silla, manteniendo el taln [place].  Mantenga esta posicin durante __________ segundos. Reptalo __________ veces. Realice este estiramiento __________ Anthoney Harada por  da.  ELONGACIN - Gastroc [employer] en la pared.  Extienda la pierna derecha / izquierdo y Dietitian la rodilla levemente flexionada.  Apunte los dedos ligeramente hacia adentro con el pie de atrs.  Mantenga el taln derecha / izquierdo en el suelo y la rodilla recta, cambie el peso hacia la pared y no permita que la espalda se arquee.  Debe sentir un estiramiento en la pantorrila derecha / izquierdo. Mantenga esta posicicin durante __________ segundos. Reptalo __________ veces. Realice este estiramiento __________ Anthoney Harada por da. ELONGACIN - Sleo De pie  [employer] en la pared.  Extienda la pierna derecha / izquierdo y Dietitian la rodilla levemente flexionada.  Apunte los dedos ligeramente hacia adentro con el pie de atrs.  Mantenga el taln derecha / izquierdo en el suelo, doble la rodilla de atrs y cambie suavemente el peso sobre la pierna de atrs hasta que sienta un ligero estiramiento en la pantorrilla de atrs.  Mantenga esta posicicin durante __________ segundos. Reptalo __________ veces. Realice este estiramiento __________ Anthoney Harada por da. ELONGACIN - Gastrocsoleus De pie Nota: Este ejercicio puede ser muy estresante para el pie y el tobillo. Realice los ejercicios slo en la forma indicada por el profesional que lo asiste.   Coloque la regin metatarsiana del pie derecha / izquierdo y Scientist, research (medical) en el mismo escaln.  Si es necesario, sostngase de la pared o de la barandilla de una escalera para mantener el equilibrio.  Levante lentamente el [employer], y permita que el peso del cuerpo presione el taln sobre el borde del escaln.  Debe sentir un estiramiento en la pantorrilla derecha / izquierdo.  Mantenga esta posicicin durante __________ segundos.  Repita este ejercicio con la rodilla derecha / izquierdo levemente flexionada. Reptalo __________ veces. Realice este estiramiento __________ Anthoney Harada por da.  EJERCICIOS DE FORTALECIMIENTO -  Fascitis plantar (sndrome del espoln en el taln) Estos ejercicios le ayudarn en la recuperacin de la lesin. Los sntomas podrn desaparecer con o sin mayor intervencin del profesional, el fisioterapeuta o Orthoptist. Al completar estos ejercicios, recuerde:   Los msculos pueden ganar tanto la resistencia como la fortaleza que necesita para sus actividades diarias a travs de ejercicios controlados.  Realice los ejercicios como se lo indic el mdico, el fisioterapeuta o Orthoptist. Aumente la resistencia y las repeticiones segn se le haya indicado. FUERZA - Rollo de toalla  Sintese en una silla en una superficie no alfombrada.  Coloque el pie en [address], y mantenga el taln en el suelo.  Coloque la toalla alrededor del taln pero envuelva slo los dedos del pie. Mantenga el taln contra el piso.  Aada ____________________ al extremo de la toalla si el mdico, fisioterapeuta o entrenador se lo indican. Reptalo __________ veces. Realice este ejercicio __________ veces por da.  FUERZA - Inversin del tobillo  Asegure un extremo de una banda de goma para ejercicios a un objeto fijo (mesa, columna). Ate el extremo opuesto a su pie, justo antes de los dedos.  Coloque los puos [place] rodillas. Esto har que la fuerza se concentre en el tobillo.  Lentamente, tire del dedo gordo [place] y Honor Junes y asegrese de que la banda est posicionada de tal forma que puede resistir el movimiento completo.  Mantenga esta posicicin durante __________ segundos.  Haga que los msculos resistan la banda mientras tira lentamente el pie hacia atrs hasta la posicin inicial. Reptalo __________ veces. Realice este ejercicio __________ veces por da.    Esta informacin no tiene Theme park manager el consejo del mdico. Asegrese de hacerle al mdico cualquier pregunta que tenga.   Document Released: 04/28/2006 Document Revised: 11/26/2014 Elsevier Interactive Patient  Education 2016 [employer]. Bonnita Levan de meniscos con rehabilitacin fase I (Meniscus Tear With Phase I Rehab) El menisco es un trozo de TEFL teacher con forma de C, ubicado en la articulacin de la rodilla, entre la tibia y Writer. Hay dos meniscos en cada articulacin de la rodilla: interno y externo. El menisco acta como un adaptador entre la tibia y el fmur y permite que los huesos de la tibia y el fmur se ensamblen. Tambin funciona como amortiguador, para reducir el estrs sobre la articulacin de la rodilla y ayudar a suministrar nutrientes al cartlago de Nurse, learning disability. Cuando las [employer], los meniscos se endurecen y se vuelven ms vulnerables a las lesiones. Es una lesin frecuente, especialmente en los deportistas de [place]. La fractura de menisco interno es ms comn que la del externo.  SNTOMAS  Dolor en la rodilla, especialmente al permanecer parado sobre la pierna afectada.  Sensibilidad en la lnea de la articulacin.  Inflamacin en la articulacin de la rodilla (efusin), que por lo general comienza entre 1 y 2 [address] despus de la lesin.  La rodilla se traba, o se queda atrapada la articulacin, lo que ocasiona que no se pueda estirar por completo.  La rodilla se tuerce o se afloja. CAUSAS La ruptura se produce cuando se aplica una fuerza mayor que la que puede soportar el menisco. Las causas ms frecuentes de la lesin son:  Golpe directo en la rodilla (traumatismo).  Golpe directo en la rodilla, girar, pivotar o cortar (cambiar rpidamente de direccin al correr), as Brayton Mars al arrodillarse o ponerse en cuclillas.  Sin traumatismo, por un proceso de envejecimiento. LOS RIESGOS AUMENTAN CON:  En los deportes de contacto (ftbol americano, rugby).  Los deportes en los cuales se requiere Comptroller (ftbol, lacrosse),o en los que se requieren tapones grandes y que requieren cambios bruscos de direccin (squash, deportes con raqueta,  bsquet).  Lesin previa en la rodilla.  Lesin asociada en la rodilla, en particular lesiones en los ligamentos.  Poca fuerza y flexibilidad. PREVENCIN   Precalentamiento adecuado y elongacin antes de la [place].  Mantener la forma fsica:  Earma Reading, flexibilidad y resistencia muscular.  Capacidad cardiovascular.  Proteja la rodilla con dispositivos ortopdicos o vendajes compresivos.  Utilice el equipo adecuado (tamao adecuado de los tapones segn la superficie). PRONSTICO Estas fracturas a menudo se curan por s solas. Sin embargo, a menudo requiere [place] y 6 meses de recuperacin.  COMPLICACIONES RELACIONADAS  La recurrencia de los sntomas puede dar como resultado un problema crnico.  Traumatismo repetido de la rodilla, en especial si se retoma el deporte rpidamente luego de  la lesin o la [place].  Progresin del desgarro (se agranda) si no se trata.  Artritis en la rodilla luego de algunos aos (con o sin [place]).  Complicaciones en la ciruga que incluyen infeccin, hemorragia, lesiones a los nervios (adormecimiento, debilitamiento, parlisis) dolor continuo, se tuerce o se traba la rodilla, el menisco no se cura (si fue reparado), necesidad de [place], y rigidez de la rodilla (prdida de movimiento). TRATAMIENTO El tratamiento inicial incluye el uso de medicamentos y la aplicacin de hielo para reducir Chief Technology Officer y la inflamacin. La utilizacin de [employer] podr ser de [place]. Sin embargo Careers information officer peso con la pierna afectada, si el dolor se lo permite. La ciruga se recomienda a menudo como tratamiento definitivo. La ciruga suele realizarse a travs de una incisin cerca de la articulacin (artroscopa). Se elimina la parte desgarrada del menisco y si es posible se repara el cartlago. Luego de la [place], se inmoviliza la articulacin. Luego de la inmovilizacin es importante completar los ejercicios de fortalecimiento y elongacin para recobrar  la fuerza y la amplitud de movimientos. Los ejercicios pueden Management consultant o con un terapeuta.  MEDICAMENTOS   Si es necesaria la administracin de medicamentos para Chief Technology Officer, se recomiendan los antiinflamatorios no esteroides, como aspirina e ibuprofeno y otros calmantes menores, como acetaminofeno.  No tome medicamentos para el dolor dentro de los [address] previos a la [place].  El profesional podr prescribirle calmantes si lo considera necesario. Utilcelos como se le indique y slo cuando lo necesite. CALOR Y FRO   El fro (con hielo) debe aplicarse durante 10 a 15 minutos cada 2  3 horas para reducir la inflamacin y Chief Technology Officer e inmediatamente despus de cualquier actividad que agrava los sntomas. Utilice bolsas o un masaje de hielo.  El calor puede usarse antes de Therapist, music y de las actividades de fortalecimiento indicadas por el profesional, le fisioterapeuta o Orthoptist. Utilice una bolsa trmica o un pao hmedo. SOLICITE ATENCIN MDICA SI:  Los sntomas empeoran o no mejoran en 2 semanas, a pesar de Medical illustrator.  Desarrolla nuevos e inexplicables sntomas. (Los medicamentos indicados en el tratamiento le ocasionan efectos secundarios). EJERCICIOS EJERCICIOS DE AMPLITUD DE MOVIMIENTOS Cherlynn June - Ruptura del menisco, no quirrgico, Fase I Estos son algunos ejercicios iniciales con los que puede comenzar el programa de rehabilitacin hasta que vea nuevamente al profesional que lo asiste o hasta que los sntomas hayan desaparecido. Recuerde:   Los ejercicios iniciales deben ser suaves. Le ayudarn a reestablecer el movimiento sin aumentar la hinchazn.  Al completar estos ejercicios podr realizar movimientos con menos dolor y estar preparado para ejercicios de fortalecimiento ms agresivos de la Fase II.  Para que sea efectiva, cada elongacin debe realizarse durante al menos 30 segundos.  La elongacin nunca debe ser dolorosa. Deber sentir slo un  alargamiento o distensin suave del tejido que estira. AMPLITUD DE MOVIMIENTOS - Flexin de la rodilla, activo  Recustese sobre la espalda con las rodillas rectas. (Si esto le ocasiona molestias en la espalda, doble la rodilla opuesta, apoyando el pie en el suelo).  Lentamente deslice el taln hacia sus nalgas, hasta que sienta una sensacin de estiramiento en la zona de la rodilla o el muslo.  Mantenga esta posicin durante __________ segundos. Vuelva lentamente el taln hasta la posicin inicial. Reptalo __________ veces. Realice este ejercicio __________ veces por da.  AMPLITUD DE MOVIMIENTOS - Flexin y extensin de la rodilla, Beverly Milch, asistida  Sintese en  el borde de una mesa o una silla, con los muslos bien apoyados. Puede ser de utilidad colocar una toalla doblada debajo de su muslo derecha / izquierdo.  Flexin (doblar): Coloque el tobillo de su pierna sana sobre el otro tobillo. Use su pierna sana para doblar suavemente su rodilla derecha / izquierdo hasta sentir una suave tensin que cruce sobre su rodilla.  Mantenga esta posicin durante __________ segundos.  Extensin (elongacin): [employer]ant posicin de los tobillos de modo que la pierna Sales executive / izquierdo [place]. Use su pierna sana para doblar suavemente su rodilla derecha / izquierdo hasta sentir una suave tensin que cruce sobre su rodilla.  Mantenga esta posicin durante __________ segundos. Reptalo __________ veces. Realice este ejercicio __________ veces por da. ELONGACIN - Flexin de la rodilla posicin supina  Acustese en el suelo, con el taln derecha / izquierdo tocando ligeramente la pared. (Coloque ambos pies en la pared, si no utiliza un marco de la puerta.)  Sin hacer esfuerzo, permita que la gravedad haga descender su pie por la pared lentamente hasta sentir un ligero estiramiento en la zona anterior de la rodilla derecha / izquierdo.  Mantenga esta posicin durante __________ segundos. Luego  vuelva la pierna a la posicin inicial usando su pierna sana, si es necesario. Reptalo __________ veces. Realice este estiramiento __________ Anthoney Harada por da.  ELONGACIN - Extensin de la rodilla en posicin sentado  Sintese con su pierna o taln derecha / izquierdo apoyado en otra silla, en una mesita de caf o en un posapis.  Relaje los msculos de la pierna, y permita que la gravedad enderece su rodilla.*  Debe sentir un estiramiento en la rodilla derecha / izquierdo. Mantenga esta posicicin durante __________ segundos. Reptalo __________ veces. Realice este estiramiento __________ Anthoney Harada por da.  *El mdico, fisioterapeuta o Herbalist, podrn indicarle que aada __________ Shelly Coss de resistencia mediante la colocacin de un objeto pesado sobre el muslo, por encima de rtula para profundizar el estiramiento.  EJERCICIOS DE FORTALECIMIENTO - Fractura de meniscos, no quirrgico Fase I Estos ejercicios le ayudarn en la recuperacin de la lesin. Los sntomas podrn desaparecer con o sin mayor intervencin del profesional, el fisioterapeuta o Orthoptist. Al completar estos ejercicios, recuerde:   Los msculos pueden ganar la resistencia y la fuerza necesarias para las actividades diarias a travs de ejercicios controlados.  Realice los ejercicios como se lo indic el mdico, el fisioterapeuta o Orthoptist. Aumente la resistencia y las repeticiones segn se le haya indicado. FUERZA - Cudriceps, isomtricos  Recustese sobre la espalda con la pierna derecha / izquierdo extendida y la rodilla opuesta flexionada.  Tensione gradualmente los msculos de la zona anterior del muslo derecha / izquierdo. Ver que la rtula se desliza hacia arriba o que se intensifica el hoyuelo que se encuentra justo por arriba de la rodilla. Este movimiento empujar nuevamente la rodilla hacia abajo en direccin al piso, [place] o cama sobre la cual est recostado.  Sostenga de ese modo el msculo, tan  apretado como pueda, sin que [employer], durante __________ segundos.  Relaje los msculos lenta y completamente entre cada repeticin. Reptalo __________ veces. Realice este ejercicio __________ veces por da.  FUERZA - Cudriceps, arcos cortos  Acustese [employer]. Coloque un rollo de __________ cm. debajo de la rodilla derecha / izquierdo para que las rodillas se doblen levemente.  Eleve slo la parte inferior de la pierna mediante la tensin de los msculos de la parte frontal del muslo. No permita  que el muslo se eleve.  Mantenga esta posicicin durante __________ segundos. Reptalo __________ veces. Realice este ejercicio __________ veces por da.  PESO OPCIONAL EN EL TOBILLO: Comience con ____________________, pero no se exceda de ____________________. Aumente de a 1 libra/0.5 kilograme.  FUERZA - Cudriceps - Levantar las piernas rectas Lo que importa es la calidad! Observe si hay seales de que el msculo cudriceps est trabajando, para estar seguro de que trabaja el fortalecimiento de los msculos correctos y no "haga trampa" mediante la sustitucin de los msculos sanos.  Recustese sobre la espalda con su pierna derecha / izquierdo extendida y la rodilla opuesta doblada.  Tensione gradualmente los msculos de la zona anterior del muslo derecha / izquierdo. Ver que la rtula se desliza hacia arriba o que se intensifica el hoyuelo que se encuentra justo por arriba de la rodilla. El muslo podr temblar un poco.  Tensione estos msculos an ms y eleve la pierna 10 a 15 cm del piso. Mantenga esta posicin durante __________ segundos.  Mientras mantiene estos msculos tensionados, baje la pierna.  Relaje los msculos lenta y completamente entre cada repeticin. Reptalo __________ veces. Realice este ejercicio __________ veces por da.  FUERZA - Isquiosurales - giros  Acustese sobre el estmago con las piernas extendidas. (Si est recostado [employer], puede  dejar que los pies cuelguen por el borde).  Contraiga los msculos de la zona posterior del muslo para doblar su rodilla derecha / izquierdo a 90 grados. Mantenga las caderas planas sobre la cama.  Mantenga esta posicicin durante __________ segundos.  Baje lentamente la pierna hasta la posicin inicial. Reptalo __________ veces. Realice este ejercicio __________ veces por da.  FUERZA - Cudriceps, cuclillas  Prese en el marco de la puerta, de modo que los pies y las rodillas queden en lnea con el marco.  Coloque las manos para mantener el equilibrio en el [place], pero no se apoye.  Bjese lentamente, doblando las caderas y las rodillas. Mantenga las piernas rectas de modo que queden paralelas al marco de la puerta. Pngase en cuclillas slo dentro del lmite en que no sienta dolor en la rodilla. Nunca deje que las caderas lleguen a un nivel inferior de las rodillas.  Vuelva lentamente a la posicin erguida, empujando con las piernas y no con las manos Reptalo __________ [employer]. Realice este ejercicio __________ veces por da.  FUERZA - Isometra cudriceps vasto medial oblicuo  Sintese en una silla con su rodilla derecha / izquierdo ligeramente doblada. Con la punta de los dedos sienta el vasto medial oblicuo arriba de la zona interna de la rodilla. Este msculo interviene en el control de la posicin de la rtula.  Mantenga la punta de los dedos [employer]. Sin mover la pierna, intente llevar la rodilla hacia abajo como si extendiera la pierna. Debe sentir que el vasto medial oblicuo se tensa. Si le resulta difcil, trate primero de hacer el mismo ejercicio con la rodilla sana.  Tense este msculo tanto como pueda sin aumentar el dolor en la rodilla.  Mantenga esta posicin durante __________ segundos. Relaje los msculos lenta y completamente entre cada repeticin. Reptalo __________ veces. Realice este ejercicio __________ veces por da.    Esta informacin no tiene Public house manager el consejo del mdico. Asegrese de hacerle al mdico cualquier pregunta que tenga.   Document Released: 04/28/2006 Document Revised: 11/26/2014 Elsevier Interactive Patient Education [employer]

## 2015-12-27 NOTE — Progress Notes (Signed)
Subjective:  By signing my name below, I, Stann Oresung-Kai Tsai, attest that this documentation has been prepared under the direction and in the presence of Norberto SorensonEva Shirah Roseman, MD. Electronically Signed: Stann Oresung-Kai Tsai, Scribe. 12/27/2015 , 8:50 AM .  Patient was seen in Room 11 .   Patient ID: Bryan [name], male    DOB: 1976/03/24, 40 y.o.   MRN: [id number] Chief Complaint  Patient presents with  . Cough    x 1 1/2 wks  . Knee Pain    left, x 2 mos, pain runs down leg to foot   HPI Bryan [name] is a 40 y.o. male who [facility] complaining of cough and left knee pain.  He has not been tried on any PPI prior. He has been on multiple nsaid's previously.   Cough Patient states having a productive cough (a little red and yellow phlegm) ongoing for about 10 days now. He reports that it started with a fever, and now has a progressively worsening sore throat. He's also noticed some ear pain as well. He mentions starting with some nasal congestion but now mostly resolved. He's been taking OTC cough syrup. He denies shortness of breath, chest tightness, or wheezing. He denies history of asthma. He denies history of smoking.   Left Knee Pain Patient states having left knee pain (around the medial aspect of left knee) that started 2 months ago, with some pain radiating down to his left heel. He informs his left knee looked swollen initially. He notices more pain when he starts moving after sitting for a long time. He denies feeling a difference with activity or walking up a hill. He has an occasional weakness in his left knee. He's taken aleve for the knee pain but without relief. He has history of right knee injury but not in the left knee.   Past Medical History  Diagnosis Date  . Chronic back pain    Prior to Admission medications   Medication Sig Start Date End Date Taking? Authorizing Provider  docusate sodium (COLACE) 100 MG capsule Take 1 capsule (100 mg total) by mouth  daily as needed for mild constipation. Patient not taking: Reported on 12/27/2015 10/04/15   Elvina SidleKurt Lauenstein, MD   No Known Allergies  Review of Systems  Constitutional: Positive for fatigue. Negative for fever and chills.  HENT: Positive for congestion, ear pain and sore throat. Negative for rhinorrhea.   Respiratory: Positive for cough. Negative for chest tightness, shortness of breath and wheezing.   Gastrointestinal: Negative for nausea, vomiting and diarrhea.  Musculoskeletal: Positive for arthralgias.       Objective:   Physical Exam  Constitutional: He is oriented to person, place, and time. He appears well-developed and well-nourished. No distress.  HENT:  Head: Normocephalic and atraumatic.  Right Ear: Tympanic membrane normal.  Left Ear: Tympanic membrane is injected, erythematous and retracted.  Nose: Nose normal.  Mouth/Throat: Oropharynx is clear and moist. Mucous membranes are dry.  Eyes: EOM are normal. Pupils are equal, round, and reactive to light.  Neck: Neck supple. No thyromegaly present.  Cardiovascular: Normal rate, regular rhythm, S1 normal, S2 normal and normal heart sounds.   No murmur heard. Pulmonary/Chest: Effort normal and breath sounds normal. No respiratory distress. He has no wheezes.  Musculoskeletal: Normal range of motion.  Full rom in the left knee, pain over the medial jointline and posterior lateral jointline, no pes anserine bursitis, no patellar mobility, negative anterior and posterior drawer, some pain  with valgus stress, positive mcmurray at the extremes of flexion  Lymphadenopathy:    He has no cervical adenopathy.  Neurological: He is alert and oriented to person, place, and time.  Skin: Skin is warm and dry.  Psychiatric: He has a normal mood and affect. His behavior is normal.  Nursing note and vitals reviewed.   BP 130/90 mmHg  Pulse 79  Temp(Src) 98 F (36.7 C) (Oral)  Resp 16  Ht 5' 9.25" (1.759 m)  Wt 235 lb 3.2 oz (106.686  kg)  BMI 34.48 kg/m2  SpO2 97%   Dg Chest 2 View  12/27/2015  CLINICAL DATA:  Hemoptysis EXAM: CHEST  2 VIEW COMPARISON:  07/21/2012 FINDINGS: The heart size and mediastinal contours are within normal limits. Both lungs are clear. The visualized skeletal structures are unremarkable. IMPRESSION: No active cardiopulmonary disease. Electronically Signed   By: Charlett Nose M.D.   On: 12/27/2015 10:26   Dg Knee 1-2 Views Left  12/27/2015  CLINICAL DATA:  Left knee pain for 2 months.  No recent injury. EXAM: LEFT KNEE - 1-2 VIEW COMPARISON:  None. FINDINGS: No evidence of fracture, dislocation, or joint effusion. No evidence of arthropathy or other focal bone abnormality. Soft tissues are unremarkable. IMPRESSION: Negative. Electronically Signed   By: Charlett Nose M.D.   On: 12/27/2015 10:26      Assessment & Plan:   1. Acute nonsuppurative otitis media of left ear   2. Hemoptysis   3. Left knee pain   4. Meniscal injury, left, initial encounter   5. Plantar fasciitis of left foot     Orders Placed This Encounter  Procedures  . DG Chest 2 View    Standing Status: Future     Number of Occurrences: 1     Standing Expiration Date: 12/26/2016    Order Specific Question:  Reason for Exam (SYMPTOM  OR DIAGNOSIS REQUIRED)    Answer:  medial joint pain    Order Specific Question:  Preferred imaging location?    Answer:  External  . DG Knee 1-2 Views Left    Standing Status: Future     Number of Occurrences: 1     Standing Expiration Date: 12/26/2016    Order Specific Question:  Reason for Exam (SYMPTOM  OR DIAGNOSIS REQUIRED)    Answer:  medial joint pain    Order Specific Question:  Preferred imaging location?    Answer:  External    Meds ordered this encounter  Medications  . omeprazole (PRILOSEC) 40 MG capsule    Sig: Take 1 capsule (40 mg total) by mouth daily. 30 minutes before a meal    Dispense:  90 capsule    Refill:  0  . meloxicam (MOBIC) 15 MG tablet    Sig: Take 1 tablet (15 mg  total) by mouth daily.    Dispense:  30 tablet    Refill:  1    Do not use with any other otc pain medication other than tylenol/acetaminophen - so no aleve, ibuprofen, motrin, advil, etc.  . amoxicillin-clavulanate (AUGMENTIN) 875-125 MG tablet    Sig: Take 1 tablet by mouth 2 (two) times daily.    Dispense:  20 tablet    Refill:  0  . HYDROcodone-homatropine (HYCODAN) 5-1.5 MG/5ML syrup    Sig: Take 5 mLs by mouth every 8 (eight) hours as needed for cough.    Dispense:  120 mL    Refill:  0  . benzonatate (TESSALON) 200 MG capsule  Sig: Take 1 capsule (200 mg total) by mouth 3 (three) times daily as needed for cough.    Dispense:  40 capsule    Refill:  0  . cyclobenzaprine (FLEXERIL) 5 MG tablet    Sig: Take 1 tablet (5 mg total) by mouth 3 (three) times daily as needed for muscle spasms.    Dispense:  60 tablet    Refill:  1    I personally performed the services described in this documentation, which was scribed in my presence. The recorded information has been reviewed and considered, and addended by me as needed.   Norberto Sorenson, M.D.  [facility] [address] [place], [place] [address] [phone] phone [phone] fax  01/05/2016 9:44 PM

## 2016-01-10 ENCOUNTER — Ambulatory Visit (INDEPENDENT_AMBULATORY_CARE_PROVIDER_SITE_OTHER): Payer: Commercial Managed Care - HMO | Admitting: Family Medicine

## 2016-01-10 VITALS — BP 136/80 | HR 98 | Temp 97.6°F | Resp 16 | Ht 69.0 in | Wt 238.0 lb

## 2016-01-10 DIAGNOSIS — M25562 Pain in left knee: Secondary | ICD-10-CM

## 2016-01-10 DIAGNOSIS — M25511 Pain in right shoulder: Secondary | ICD-10-CM | POA: Diagnosis not present

## 2016-01-10 DIAGNOSIS — M722 Plantar fascial fibromatosis: Secondary | ICD-10-CM | POA: Diagnosis not present

## 2016-01-10 MED ORDER — DICLOFENAC SODIUM 1 % TD GEL: 4.0000 g | Freq: Four times a day (QID) | TRANSDERMAL | Status: DC

## 2016-01-10 MED ORDER — NAPROXEN 500 MG PO TABS: 500.0000 mg | ORAL_TABLET | Freq: Two times a day (BID) | ORAL | Status: DC

## 2016-01-10 NOTE — Patient Instructions (Addendum)
Thank you for coming in today. Follow up with me soon.   Follow up with me Dr [facility] Address: [address] [place], [place] [address] Phone: [phone]  Use naproxen for pain by mouth . Use voltaren gel on the knee as needed.   Meniscus Tear With Phase I Rehab The meniscus is a C-shaped cartilage structure, located in the knee joint between the thigh bone (femur) and the shinbone (tibia). Two menisci are located in each knee joint: the inner and outer meniscus. The meniscus acts as an adapter between the thigh bone and shinbone, allowing them to fit properly together. It also functions as a shock absorber, to reduce the stress placed on the knee joint and to help supply nutrients to the knee joint cartilage. As people age, the meniscus begins to harden and become more vulnerable to injury. Meniscus tears are a common injury, especially in older athletes. Inner meniscus tears are more common than outer meniscus tears.  SYMPTOMS   Pain in the knee, especially with standing or squatting with the affected leg.  Tenderness along the joint line.  Swelling in the knee joint (effusion), usually starting 1 to 2 days after injury.  Locking or catching of the knee joint, causing inability to straighten the knee completely.  Giving way or buckling of the knee. CAUSES  A meniscus tear occurs when a force is placed on the meniscus that is greater than it can handle. Common causes of injury include:  Direct hit (trauma) to the knee.  Twisting, pivoting, or cutting (rapidly changing direction while running), kneeling or squatting.  Without injury, due to aging. RISK INCREASES WITH:  Contact sports (football, rugby).  Sports in which cleats are used with pivoting (soccer, lacrosse) or sports in which good shoe grip and sudden change in direction are required (racquetball, basketball, squash).  Previous knee injury.  Associated knee injury, particularly  ligament injuries.  Poor strength and flexibility. PREVENTION  Warm up and stretch properly before activity.  Maintain physical fitness:  Strength, flexibility, and endurance.  Cardiovascular fitness.  Protect the knee with a brace or elastic bandage.  Wear properly fitted protective equipment (proper cleats for the surface). PROGNOSIS  Sometimes, meniscus tears heal on their own. However, definitive treatment requires surgery, followed by at least 6 weeks of recovery.  RELATED COMPLICATIONS   Recurring symptoms that result in a chronic problem.  Repeated knee injury, especially if sports are resumed too soon after injury or surgery.  Progression of the tear (the tear gets larger), if untreated.  Arthritis of the knee in later years (with or without surgery).  Complications of surgery, including infection, bleeding, injury to nerves (numbness, weakness, paralysis) continued pain, giving way, locking, nonhealing of meniscus (if repaired), need for further surgery, and knee stiffness (loss of motion). TREATMENT  Treatment first involves the use of ice and medicine, to reduce pain and inflammation. You may find using crutches to walk more comfortable. However, it is okay to bear weight on the injured knee, if the pain will allow it. Surgery is often advised as a definitive treatment. Surgery is performed through an incision near the joint (arthroscopically). The torn piece of the meniscus is removed, and if possible the joint cartilage is repaired. After surgery, the joint must be restrained. After restraint, it is important to perform strengthening and stretching exercises to help regain strength and a full range of motion. These exercises may be completed at home or with a therapist.  MEDICATION  If pain medicine is needed, nonsteroidal anti-inflammatory medicines (aspirin and ibuprofen), or other minor pain relievers (acetaminophen), are often advised.  Do not take pain medicine  for 7 days before surgery.  Prescription pain relievers may be given, if your caregiver thinks they are needed. Use only as directed and only as much as you need. HEAT AND COLD  Cold treatment (icing) should be applied for 10 to 15 minutes every 2 to 3 hours for inflammation and pain, and immediately after activity that aggravates your symptoms. Use ice packs or an ice massage.  Heat treatment may be used before performing stretching and strengthening activities prescribed by your caregiver, physical therapist, or athletic trainer. Use a heat pack or a warm water soak. SEEK MEDICAL CARE IF:   Symptoms get worse or do not improve in 2 weeks, despite treatment.  New, unexplained symptoms develop. (Drugs used in treatment may produce side effects.) EXERCISES RANGE OF MOTION (ROM) AND STRETCHING EXERCISES - Meniscus Tear, Non-operative, Phase I These are some of the initial exercises with which you may start your rehabilitation program, until you see your caregiver again or until your symptoms are resolved. Remember:   These initial exercises are intended to be gentle. They will help you restore motion without increasing any swelling.  Completing these exercises allows less painful movement and prepares you for the more aggressive strengthening exercises in Phase II.  An effective stretch should be held for at least 30 seconds.  A stretch should never be painful. You should only feel a gentle lengthening or release in the stretched tissue. RANGE OF MOTION - Knee Flexion, Active  Lie on your back with both knees straight. (If this causes back discomfort, bend your healthy knee, placing your foot flat on the floor.)  Slowly slide your heel back toward your buttocks until you feel a gentle stretch in the front of your knee or thigh.  Hold for __________ seconds. Slowly slide your heel back to the starting position. Repeat __________ times. Complete this exercise __________ times per day.   RANGE OF MOTION - Knee Flexion and Extension, Active-Assisted  Sit on the edge of a table or chair with your thighs firmly supported. It may be helpful to place a folded towel under the end of your right / left thigh.  Flexion (bending): Place the ankle of your healthy leg on top of the other ankle. Use your healthy leg to gently bend your right / left knee until you feel a mild tension across the top of your knee.  Hold for __________ seconds.  Extension (straightening): Switch your ankles so your right / left leg is on top. Use your healthy leg to straighten your right / left knee until you feel a mild tension on the backside of your knee.  Hold for __________ seconds. Repeat __________ times. Complete __________ times per day. STRETCH - Knee Flexion, Supine  Lie on the floor with your right / left heel and foot lightly touching the wall. (Place both feet on the wall if you do not use a door frame.)  Without using any effort, allow gravity to slide your foot down the wall slowly until you feel a gentle stretch in the front of your right / left knee.  Hold this stretch for __________ seconds. Then return the leg to the starting position, using your healthy leg for help, if needed. Repeat __________ times. Complete this stretch __________ times per day.  STRETCH - Knee Extension Sitting  Sit with  your right / left leg/heel propped on another chair, coffee table, or foot stool.  Allow your leg muscles to relax, letting gravity straighten out your knee.*  You should feel a stretch behind your right / left knee. Hold this position for __________ seconds. Repeat __________ times. Complete this stretch __________ times per day.  *Your physician, physical therapist or athletic trainer may instruct you place a __________ weight on your thigh, just above your kneecap, to deepen the stretch.  STRENGTHENING EXERCISES - Meniscus Tear, Non-operative, Phase I These exercises may help you when  beginning to rehabilitate your injury. They may resolve your symptoms with or without further involvement from your physician, physical therapist or athletic trainer. While completing these exercises, remember:   Muscles can gain both the endurance and the strength needed for everyday activities through controlled exercises.  Complete these exercises as instructed by your physician, physical therapist or athletic trainer. Progress the resistance and repetitions only as guided. STRENGTH - Quadriceps, Isometrics  Lie on your back with your right / left leg extended and your opposite knee bent.  Gradually tense the muscles in the front of your right / left thigh. You should see either your knee cap slide up toward your hip or increased dimpling just above the knee. This motion will push the back of the knee down toward the floor, mat, or bed on which you are lying.  Hold the muscle as tight as you can, without increasing your pain, for __________ seconds.  Relax the muscles slowly and completely between each repetition. Repeat __________ times. Complete this exercise __________ times per day.  STRENGTH - Quadriceps, Short Arcs   Lie on your back. Place a __________ inch towel roll under your right / left knee, so that the knee bends slightly.  Raise only your lower leg by tightening the muscles in the front of your thigh. Do not allow your thigh to rise.  Hold this position for __________ seconds. Repeat __________ times. Complete this exercise __________ times per day.  OPTIONAL ANKLE WEIGHTS: Begin with ____________________, but DO NOT exceed ____________________. Increase in 1 pound/0.5 kilogram increments. STRENGTH - Quadriceps, Straight Leg Raises  Quality counts! Watch for signs that the quadriceps muscle is working, to be sure you are strengthening the correct muscles and not "cheating" by substituting with healthier muscles.  Lay on your back with your right / left leg extended and  your opposite knee bent.  Tense the muscles in the front of your right / left thigh. You should see either your knee cap slide up or increased dimpling just above the knee. Your thigh may even shake a bit.  Tighten these muscles even more and raise your leg 4 to 6 inches off the floor. Hold for __________ seconds.  Keeping these muscles tense, lower your leg.  Relax the muscles slowly and completely in between each repetition. Repeat __________ times. Complete this exercise __________ times per day.  STRENGTH - Hamstring, Curls   Lay on your stomach with your legs extended. (If you lay on a bed, your feet may hang over the edge.)  Tighten the muscles in the back of your thigh to bend your right / left knee up to 90 degrees. Keep your hips flat on the bed.  Hold this position for __________ seconds.  Slowly lower your leg back to the starting position. Repeat __________ times. Complete this exercise __________ times per day.  STRENGTH - Quadriceps, Squats  Stand in a door frame so that your  feet and knees are in line with the frame.  Use your hands for balance, not support, on the frame.  Slowly lower your weight, bending at the hips and knees. Keep your lower legs upright so that they are parallel with the door frame. Squat only within the range that does not increase your knee pain. Never let your hips drop below your knees.  Slowly return upright, pushing with your legs, not pulling with your hands. Repeat __________ times. Complete this exercise __________ times per day.  STRENGTH - Quad/VMO, Isometric   Sit in a chair with your right / left knee slightly bent. With your fingertips, feel the VMO muscle just above the inside of your knee. The VMO is important in controlling the position of your kneecap.  Keeping your fingertips on this muscle. Without actually moving your leg, attempt to drive your knee down as if straightening your leg. You should feel your VMO tense. If you have  a difficult time, you may wish to try the same exercise on your healthy knee first.  Tense this muscle as hard as you can without increasing any knee pain.  Hold for __________ seconds. Relax the muscles slowly and completely in between each repetition. Repeat __________ times. Complete exercise __________ times per day.    This information is not intended to replace advice given to you by your health care provider. Make sure you discuss any questions you have with your health care provider.   Document Released: 07/26/1998 Document Revised: 11/26/2014 Document Reviewed: 10/24/2008 Else[employer] Interactive Patient Education 2016 [employer]   Plantar Fasciitis With Rehab The plantar fascia is a fibrous, ligament-like, soft-tissue structure that spans the bottom of the foot. Plantar fasciitis, also called heel spur syndrome, is a condition that causes pain in the foot due to inflammation of the tissue. SYMPTOMS   Pain and tenderness on the underneath side of the foot.  Pain that worsens with standing or walking. CAUSES  Plantar fasciitis is caused by irritation and injury to the plantar fascia on the underneath side of the foot. Common mechanisms of injury include:  Direct trauma to bottom of the foot.  Damage to a small nerve that runs under the foot where the main fascia attaches to the heel bone.  Stress placed on the plantar fascia due to bone spurs. RISK INCREASES WITH:   Activities that place stress on the plantar fascia (running, jumping, pivoting, or cutting).  Poor strength and flexibility.  Improperly fitted shoes.  Tight calf muscles.  Flat feet.  Failure to warm-up properly before activity.  Obesity. PREVENTION  Warm up and stretch properly before activity.  Allow for adequate recovery between workouts.  Maintain physical fitness:  Strength, flexibility, and endurance.  Cardiovascular fitness.  Maintain a health body weight.  Avoid stress on the  plantar fascia.  Wear properly fitted shoes, including arch supports for individuals who have flat feet. PROGNOSIS  If treated properly, then the symptoms of plantar fasciitis usually resolve without surgery. However, occasionally surgery is necessary. RELATED COMPLICATIONS   Recurrent symptoms that may result in a chronic condition.  Problems of the lower back that are caused by compensating for the injury, such as limping.  Pain or weakness of the foot during push-off following surgery.  Chronic inflammation, scarring, and partial or complete fascia tear, occurring more often from repeated injections. TREATMENT  Treatment initially involves the use of ice and medication to help reduce pain and inflammation. The use of strengthening and stretching exercises may  help reduce pain with activity, especially stretches of the Achilles tendon. These exercises may be performed at home or with a therapist. Your caregiver may recommend that you use heel cups of arch supports to help reduce stress on the plantar fascia. Occasionally, corticosteroid injections are given to reduce inflammation. If symptoms persist for greater than 6 months despite non-surgical (conservative), then surgery may be recommended.  MEDICATION   If pain medication is necessary, then nonsteroidal anti-inflammatory medications, such as aspirin and ibuprofen, or other minor pain relievers, such as acetaminophen, are often recommended.  Do not take pain medication within 7 days before surgery.  Prescription pain relievers may be given if deemed necessary by your caregiver. Use only as directed and only as much as you need.  Corticosteroid injections may be given by your caregiver. These injections should be reserved for the most serious cases, because they may only be given a certain number of times. HEAT AND COLD  Cold treatment (icing) relieves pain and reduces inflammation. Cold treatment should be applied for 10 to 15  minutes every 2 to 3 hours for inflammation and pain and immediately after any activity that aggravates your symptoms. Use ice packs or massage the area with a piece of ice (ice massage).  Heat treatment may be used prior to performing the stretching and strengthening activities prescribed by your caregiver, physical therapist, or athletic trainer. Use a heat pack or soak the injury in warm water. SEEK IMMEDIATE MEDICAL CARE IF:  Treatment seems to offer no benefit, or the condition worsens.  Any medications produce adverse side effects. EXERCISES RANGE OF MOTION (ROM) AND STRETCHING EXERCISES - Plantar Fasciitis (Heel Spur Syndrome) These exercises may help you when beginning to rehabilitate your injury. Your symptoms may resolve with or without further involvement from your physician, physical therapist or athletic trainer. While completing these exercises, remember:   Restoring tissue flexibility helps normal motion to return to the joints. This allows healthier, less painful movement and activity.  An effective stretch should be held for at least 30 seconds.  A stretch should never be painful. You should only feel a gentle lengthening or release in the stretched tissue. RANGE OF MOTION - Toe Extension, Flexion  Sit with your right / left leg crossed over your opposite knee.  Grasp your toes and gently pull them back toward the top of your foot. You should feel a stretch on the bottom of your toes and/or foot.  Hold this stretch for __________ seconds.  Now, gently pull your toes toward the bottom of your foot. You should feel a stretch on the top of your toes and or foot.  Hold this stretch for __________ seconds. Repeat __________ times. Complete this stretch __________ times per day.  RANGE OF MOTION - Ankle Dorsiflexion, Active Assisted  Remove shoes and sit on a chair that is preferably not on a carpeted surface.  Place right / left foot under knee. Extend your opposite leg  for support.  Keeping your heel down, slide your right / left foot back toward the chair until you feel a stretch at your ankle or calf. If you do not feel a stretch, slide your bottom forward to the edge of the chair, while still keeping your heel down.  Hold this stretch for __________ seconds. Repeat __________ times. Complete this stretch __________ times per day.  STRETCH - Gastroc, Standing  Place hands on wall.  Extend right / left leg, keeping the front knee somewhat bent.  Slightly point  your toes inward on your back foot.  Keeping your right / left heel on the floor and your knee straight, shift your weight toward the wall, not allowing your back to arch.  You should feel a gentle stretch in the right / left calf. Hold this position for __________ seconds. Repeat __________ times. Complete this stretch __________ times per day. STRETCH - Soleus, Standing  Place hands on wall.  Extend right / left leg, keeping the other knee somewhat bent.  Slightly point your toes inward on your back foot.  Keep your right / left heel on the floor, bend your back knee, and slightly shift your weight over the back leg so that you feel a gentle stretch deep in your back calf.  Hold this position for __________ seconds. Repeat __________ times. Complete this stretch __________ times per day. STRETCH - Gastrocsoleus, Standing  Note: This exercise can place a lot of stress on your foot and ankle. Please complete this exercise only if specifically instructed by your caregiver.   Place the ball of your right / left foot on a step, keeping your other foot firmly on the same step.  Hold on to the wall or a rail for balance.  Slowly lift your other foot, allowing your body weight to press your heel down over the edge of the step.  You should feel a stretch in your right / left calf.  Hold this position for __________ seconds.  Repeat this exercise with a slight bend in your right / left  knee. Repeat __________ times. Complete this stretch __________ times per day.  STRENGTHENING EXERCISES - Plantar Fasciitis (Heel Spur Syndrome)  These exercises may help you when beginning to rehabilitate your injury. They may resolve your symptoms with or without further involvement from your physician, physical therapist or athletic trainer. While completing these exercises, remember:   Muscles can gain both the endurance and the strength needed for everyday activities through controlled exercises.  Complete these exercises as instructed by your physician, physical therapist or athletic trainer. Progress the resistance and repetitions only as guided. STRENGTH - Towel Curls  Sit in a chair positioned on a non-carpeted surface.  Place your foot on a towel, keeping your heel on the floor.  Pull the towel toward your heel by only curling your toes. Keep your heel on the floor.  If instructed by your physician, physical therapist or athletic trainer, add ____________________ at the end of the towel. Repeat __________ times. Complete this exercise __________ times per day. STRENGTH - Ankle Inversion  Secure one end of a rubber exercise band/tubing to a fixed object (table, pole). Loop the other end around your foot just before your toes.  Place your fists between your knees. This will focus your strengthening at your ankle.  Slowly, pull your big toe up and in, making sure the band/tubing is positioned to resist the entire motion.  Hold this position for __________ seconds.  Have your muscles resist the band/tubing as it slowly pulls your foot back to the starting position. Repeat __________ times. Complete this exercises __________ times per day.    This information is not intended to replace advice given to you by your health care provider. Make sure you discuss any questions you have with your health care provider.   Document Released: 07/12/2005 Document Revised: 11/26/2014 Document  Reviewed: 10/24/2008 Else[employer] Interactive Patient Education 2016 [employer]   Impingement Syndrome, Rotator Cuff, Bursitis With Rehab Impingement syndrome is a condition that involves inflammation  of the tendons of the rotator cuff and the subacromial bursa, that causes pain in the shoulder. The rotator cuff consists of four tendons and muscles that control much of the shoulder and upper arm function. The subacromial bursa is a fluid filled sac that helps reduce friction between the rotator cuff and one of the bones of the shoulder (acromion). Impingement syndrome is usually an overuse injury that causes swelling of the bursa (bursitis), swelling of the tendon (tendonitis), and/or a tear of the tendon (strain). Strains are classified into three categories. Grade 1 strains cause pain, but the tendon is not lengthened. Grade 2 strains include a lengthened ligament, due to the ligament being stretched or partially ruptured. With grade 2 strains there is still function, although the function may be decreased. Grade 3 strains include a complete tear of the tendon or muscle, and function is usually impaired. SYMPTOMS   Pain around the shoulder, often at the outer portion of the upper arm.  Pain that gets worse with shoulder function, especially when reaching overhead or lifting.  Sometimes, aching when not using the arm.  Pain that wakes you up at night.  Sometimes, tenderness, swelling, warmth, or redness over the affected area.  Loss of strength.  Limited motion of the shoulder, especially reaching behind the back (to the back pocket or to unhook bra) or across your body.  Crackling sound (crepitation) when moving the arm.  Biceps tendon pain and inflammation (in the front of the shoulder). Worse when bending the elbow or lifting. CAUSES  Impingement syndrome is often an overuse injury, in which chronic (repetitive) motions cause the tendons or bursa to become inflamed. A strain occurs when  a force is paced on the tendon or muscle that is greater than it can withstand. Common mechanisms of injury include: Stress from sudden increase in duration, frequency, or intensity of training.  Direct hit (trauma) to the shoulder.  Aging, erosion of the tendon with normal use.  Bony bump on shoulder (acromial spur). RISK INCREASES WITH:  Contact sports (football, wrestling, boxing).  Throwing sports (baseball, tennis, volleyball).  Weightlifting and bodybuilding.  Heavy labor.  Previous injury to the rotator cuff, including impingement.  Poor shoulder strength and flexibility.  Failure to warm up properly before activity.  Inadequate protective equipment.  Old age.  Bony bump on shoulder (acromial spur). PREVENTION   Warm up and stretch properly before activity.  Allow for adequate recovery between workouts.  Maintain physical fitness:  Strength, flexibility, and endurance.  Cardiovascular fitness.  Learn and use proper exercise technique. PROGNOSIS  If treated properly, impingement syndrome usually goes away within 6 weeks. Sometimes surgery is required.  RELATED COMPLICATIONS   Longer healing time if not properly treated, or if not given enough time to heal.  Recurring symptoms, that result in a chronic condition.  Shoulder stiffness, frozen shoulder, or loss of motion.  Rotator cuff tendon tear.  Recurring symptoms, especially if activity is resumed too soon, with overuse, with a direct blow, or when using poor technique. TREATMENT  Treatment first involves the use of ice and medicine, to reduce pain and inflammation. The use of strengthening and stretching exercises may help reduce pain with activity. These exercises may be performed at home or with a therapist. If non-surgical treatment is unsuccessful after more than 6 months, surgery may be advised. After surgery and rehabilitation, activity is usually possible in 3 months.  MEDICATION  If pain  medicine is needed, nonsteroidal anti-inflammatory medicines (aspirin and ibuprofen),  or other minor pain relievers (acetaminophen), are often advised.  Do not take pain medicine for 7 days before surgery.  Prescription pain relievers may be given, if your caregiver thinks they are needed. Use only as directed and only as much as you need.  Corticosteroid injections may be given by your caregiver. These injections should be reserved for the most serious cases, because they may only be given a certain number of times. HEAT AND COLD  Cold treatment (icing) should be applied for 10 to 15 minutes every 2 to 3 hours for inflammation and pain, and immediately after activity that aggravates your symptoms. Use ice packs or an ice massage.  Heat treatment may be used before performing stretching and strengthening activities prescribed by your caregiver, physical therapist, or athletic trainer. Use a heat pack or a warm water soak. SEEK MEDICAL CARE IF:   Symptoms get worse or do not improve in 4 to 6 weeks, despite treatment.  New, unexplained symptoms develop. (Drugs used in treatment may produce side effects.) EXERCISES  RANGE OF MOTION (ROM) AND STRETCHING EXERCISES - Impingement Syndrome (Rotator Cuff  Tendinitis, Bursitis) These exercises may help you when beginning to rehabilitate your injury. Your symptoms may go away with or without further involvement from your physician, physical therapist or athletic trainer. While completing these exercises, remember:   Restoring tissue flexibility helps normal motion to return to the joints. This allows healthier, less painful movement and activity.  An effective stretch should be held for at least 30 seconds.  A stretch should never be painful. You should only feel a gentle lengthening or release in the stretched tissue. STRETCH - Flexion, Standing  Stand with good posture. With an underhand grip on your right / left hand, and an overhand grip on  the opposite hand, grasp a broomstick or cane so that your hands are a little more than shoulder width apart.  Keeping your right / left elbow straight and shoulder muscles relaxed, push the stick with your opposite hand, to raise your right / left arm in front of your body and then overhead. Raise your arm until you feel a stretch in your right / left shoulder, but before you have increased shoulder pain.  Try to avoid shrugging your right / left shoulder as your arm rises, by keeping your shoulder blade tucked down and toward your mid-back spine. Hold for __________ seconds.  Slowly return to the starting position. Repeat __________ times. Complete this exercise __________ times per day. STRETCH - Abduction, Supine  Lie on your back. With an underhand grip on your right / left hand and an overhand grip on the opposite hand, grasp a broomstick or cane so that your hands are a little more than shoulder width apart.  Keeping your right / left elbow straight and your shoulder muscles relaxed, push the stick with your opposite hand, to raise your right / left arm out to the side of your body and then overhead. Raise your arm until you feel a stretch in your right / left shoulder, but before you have increased shoulder pain.  Try to avoid shrugging your right / left shoulder as your arm rises, by keeping your shoulder blade tucked down and toward your mid-back spine. Hold for __________ seconds.  Slowly return to the starting position. Repeat __________ times. Complete this exercise __________ times per day. ROM - Flexion, Active-Assisted  Lie on your back. You may bend your knees for comfort.  Grasp a broomstick or cane so  your hands are about shoulder width apart. Your right / left hand should grip the end of the stick, so that your hand is positioned "thumbs-up," as if you were about to shake hands.  Using your healthy arm to lead, raise your right / left arm overhead, until you feel a gentle  stretch in your shoulder. Hold for __________ seconds.  Use the stick to assist in returning your right / left arm to its starting position. Repeat __________ times. Complete this exercise __________ times per day.  ROM - Internal Rotation, Supine   Lie on your back on a firm surface. Place your right / left elbow about 60 degrees away from your side. Elevate your elbow with a folded towel, so that the elbow and shoulder are the same height.  Using a broomstick or cane and your strong arm, pull your right / left hand toward your body until you feel a gentle stretch, but no increase in your shoulder pain. Keep your shoulder and elbow in place throughout the exercise.  Hold for __________ seconds. Slowly return to the starting position. Repeat __________ times. Complete this exercise __________ times per day. STRETCH - Internal Rotation  Place your right / left hand behind your back, palm up.  Throw a towel or belt over your opposite shoulder. Grasp the towel with your right / left hand.  While keeping an upright posture, gently pull up on the towel, until you feel a stretch in the front of your right / left shoulder.  Avoid shrugging your right / left shoulder as your arm rises, by keeping your shoulder blade tucked down and toward your mid-back spine.  Hold for __________ seconds. Release the stretch, by lowering your healthy hand. Repeat __________ times. Complete this exercise __________ times per day. ROM - Internal Rotation   Using an underhand grip, grasp a stick behind your back with both hands.  While standing upright with good posture, slide the stick up your back until you feel a mild stretch in the front of your shoulder.  Hold for __________ seconds. Slowly return to your starting position. Repeat __________ times. Complete this exercise __________ times per day.  STRETCH - Posterior Shoulder Capsule   Stand or sit with good posture. Grasp your right / left elbow and draw  it across your chest, keeping it at the same height as your shoulder.  Pull your elbow, so your upper arm comes in closer to your chest. Pull until you feel a gentle stretch in the back of your shoulder.  Hold for __________ seconds. Repeat __________ times. Complete this exercise __________ times per day. STRENGTHENING EXERCISES - Impingement Syndrome (Rotator Cuff Tendinitis, Bursitis) These exercises may help you when beginning to rehabilitate your injury. They may resolve your symptoms with or without further involvement from your physician, physical therapist or athletic trainer. While completing these exercises, remember:  Muscles can gain both the endurance and the strength needed for everyday activities through controlled exercises.  Complete these exercises as instructed by your physician, physical therapist or athletic trainer. Increase the resistance and repetitions only as guided.  You may experience muscle soreness or fatigue, but the pain or discomfort you are trying to eliminate should never worsen during these exercises. If this pain does get worse, stop and make sure you are following the directions exactly. If the pain is still present after adjustments, discontinue the exercise until you can discuss the trouble with your clinician.  During your recovery, avoid activity or exercises which  involve actions that place your injured hand or elbow above your head or behind your back or head. These positions stress the tissues which you are trying to heal. STRENGTH - Scapular Depression and Adduction   With good posture, sit on a firm chair. Support your arms in front of you, with pillows, arm rests, or on a table top. Have your elbows in line with the sides of your body.  Gently draw your shoulder blades down and toward your mid-back spine. Gradually increase the tension, without tensing the muscles along the top of your shoulders and the back of your neck.  Hold for __________  seconds. Slowly release the tension and relax your muscles completely before starting the next repetition.  After you have practiced this exercise, remove the arm support and complete the exercise in standing as well as sitting position. Repeat __________ times. Complete this exercise __________ times per day.  STRENGTH - Shoulder Abductors, Isometric  With good posture, stand or sit about 4-6 inches from a wall, with your right / left side facing the wall.  Bend your right / left elbow. Gently press your right / left elbow into the wall. Increase the pressure gradually, until you are pressing as hard as you can, without shrugging your shoulder or increasing any shoulder discomfort.  Hold for __________ seconds.  Release the tension slowly. Relax your shoulder muscles completely before you begin the next repetition. Repeat __________ times. Complete this exercise __________ times per day.  STRENGTH - External Rotators, Isometric  Keep your right / left elbow at your side and bend it 90 degrees.  Step into a door frame so that the outside of your right / left wrist can press against the door frame without your upper arm leaving your side.  Gently press your right / left wrist into the door frame, as if you were trying to swing the back of your hand away from your stomach. Gradually increase the tension, until you are pressing as hard as you can, without shrugging your shoulder or increasing any shoulder discomfort.  Hold for __________ seconds.  Release the tension slowly. Relax your shoulder muscles completely before you begin the next repetition. Repeat __________ times. Complete this exercise __________ times per day.  STRENGTH - Supraspinatus   Stand or sit with good posture. Grasp a __________ weight, or an exercise band or tubing, so that your hand is "thumbs-up," like you are shaking hands.  Slowly lift your right / left arm in a "V" away from your thigh, diagonally into the space  between your side and straight ahead. Lift your hand to shoulder height or as far as you can, without increasing any shoulder pain. At first, many people do not lift their hands above shoulder height.  Avoid shrugging your right / left shoulder as your arm rises, by keeping your shoulder blade tucked down and toward your mid-back spine.  Hold for __________ seconds. Control the descent of your hand, as you slowly return to your starting position. Repeat __________ times. Complete this exercise __________ times per day.  STRENGTH - External Rotators  Secure a rubber exercise band or tubing to a fixed object (table, pole) so that it is at the same height as your right / left elbow when you are standing or sitting on a firm surface.  Stand or sit so that the secured exercise band is at your uninjured side.  Bend your right / left elbow 90 degrees. Place a folded towel or small pillow  under your right / left arm, so that your elbow is a few inches away from your side.  Keeping the tension on the exercise band, pull it away from your body, as if pivoting on your elbow. Be sure to keep your body steady, so that the movement is coming only from your rotating shoulder.  Hold for __________ seconds. Release the tension in a controlled manner, as you return to the starting position. Repeat __________ times. Complete this exercise __________ times per day.  STRENGTH - Internal Rotators   Secure a rubber exercise band or tubing to a fixed object (table, pole) so that it is at the same height as your right / left elbow when you are standing or sitting on a firm surface.  Stand or sit so that the secured exercise band is at your right / left side.  Bend your elbow 90 degrees. Place a folded towel or small pillow under your right / left arm so that your elbow is a few inches away from your side.  Keeping the tension on the exercise band, pull it across your body, toward your stomach. Be sure to keep your  body steady, so that the movement is coming only from your rotating shoulder.  Hold for __________ seconds. Release the tension in a controlled manner, as you return to the starting position. Repeat __________ times. Complete this exercise __________ times per day.  STRENGTH - Scapular Protractors, Standing   Stand arms length away from a wall. Place your hands on the wall, keeping your elbows straight.  Begin by dropping your shoulder blades down and toward your mid-back spine.  To strengthen your protractors, keep your shoulder blades down, but slide them forward on your rib cage. It will feel as if you are lifting the back of your rib cage away from the wall. This is a subtle motion and can be challenging to complete. Ask your caregiver for further instruction, if you are not sure you are doing the exercise correctly.  Hold for __________ seconds. Slowly return to the starting position, resting the muscles completely before starting the next repetition. Repeat __________ times. Complete this exercise __________ times per day. STRENGTH - Scapular Protractors, Supine  Lie on your back on a firm surface. Extend your right / left arm straight into the air while holding a __________ weight in your hand.  Keeping your head and back in place, lift your shoulder off the floor.  Hold for __________ seconds. Slowly return to the starting position, and allow your muscles to relax completely before starting the next repetition. Repeat __________ times. Complete this exercise __________ times per day. STRENGTH - Scapular Protractors, Quadruped  Get onto your hands and knees, with your shoulders directly over your hands (or as close as you can be, comfortably).  Keeping your elbows locked, lift the back of your rib cage up into your shoulder blades, so your mid-back rounds out. Keep your neck muscles relaxed.  Hold this position for __________ seconds. Slowly return to the starting position and  allow your muscles to relax completely before starting the next repetition. Repeat __________ times. Complete this exercise __________ times per day.  STRENGTH - Scapular Retractors  Secure a rubber exercise band or tubing to a fixed object (table, pole), so that it is at the height of your shoulders when you are either standing, or sitting on a firm armless chair.  With a palm down grip, grasp an end of the band in each hand. Straighten your  elbows and lift your hands straight in front of you, at shoulder height. Step back, away from the secured end of the band, until it becomes tense.  Squeezing your shoulder blades together, draw your elbows back toward your sides, as you bend them. Keep your upper arms lifted away from your body throughout the exercise.  Hold for __________ seconds. Slowly ease the tension on the band, as you reverse the directions and return to the starting position. Repeat __________ times. Complete this exercise __________ times per day. STRENGTH - Shoulder Extensors   Secure a rubber exercise band or tubing to a fixed object (table, pole) so that it is at the height of your shoulders when you are either standing, or sitting on a firm armless chair.  With a thumbs-up grip, grasp an end of the band in each hand. Straighten your elbows and lift your hands straight in front of you, at shoulder height. Step back, away from the secured end of the band, until it becomes tense.  Squeezing your shoulder blades together, pull your hands down to the sides of your thighs. Do not allow your hands to go behind you.  Hold for __________ seconds. Slowly ease the tension on the band, as you reverse the directions and return to the starting position. Repeat __________ times. Complete this exercise __________ times per day.  STRENGTH - Scapular Retractors and External Rotators   Secure a rubber exercise band or tubing to a fixed object (table, pole) so that it is at the height as your  shoulders, when you are either standing, or sitting on a firm armless chair.  With a palm down grip, grasp an end of the band in each hand. Bend your elbows 90 degrees and lift your elbows to shoulder height, at your sides. Step back, away from the secured end of the band, until it becomes tense.  Squeezing your shoulder blades together, rotate your shoulders so that your upper arms and elbows remain stationary, but your fists travel upward to head height.  Hold for __________ seconds. Slowly ease the tension on the band, as you reverse the directions and return to the starting position. Repeat __________ times. Complete this exercise __________ times per day.  STRENGTH - Scapular Retractors and External Rotators, Rowing   Secure a rubber exercise band or tubing to a fixed object (table, pole) so that it is at the height of your shoulders, when you are either standing, or sitting on a firm armless chair.  With a palm down grip, grasp an end of the band in each hand. Straighten your elbows and lift your hands straight in front of you, at shoulder height. Step back, away from the secured end of the band, until it becomes tense.  Step 1: Squeeze your shoulder blades together. Bending your elbows, draw your hands to your chest, as if you are rowing a boat. At the end of this motion, your hands and elbow should be at shoulder height and your elbows should be out to your sides.  Step 2: Rotate your shoulders, to raise your hands above your head. Your forearms should be vertical and your upper arms should be horizontal.  Hold for __________ seconds. Slowly ease the tension on the band, as you reverse the directions and return to the starting position. Repeat __________ times. Complete this exercise __________ times per day.  STRENGTH - Scapular Depressors  Find a sturdy chair without wheels, such as a dining room chair.  Keeping your feet on the  floor, and your hands on the chair arms, lift your  bottom up from the seat, and lock your elbows.  Keeping your elbows straight, allow gravity to pull your body weight down. Your shoulders will rise toward your ears.  Raise your body against gravity by drawing your shoulder blades down your back, shortening the distance between your shoulders and ears. Although your feet should always maintain contact with the floor, your feet should progressively support less body weight, as you get stronger.  Hold for __________ seconds. In a controlled and slow manner, lower your body weight to begin the next repetition. Repeat __________ times. Complete this exercise __________ times per day.    This information is not intended to replace advice given to you by your health care provider. Make sure you discuss any questions you have with your health care provider.   Document Released: 07/12/2005 Document Revised: 08/02/2014 Document Reviewed: 10/24/2008 [employer] Interactive Patient Education 2016 [employer].  IF you received an x-ray today, you will receive an invoice from [facility]. Please contact [facility] at [phone] with questions or concerns regarding your invoice.   IF you received labwork today, you will receive an invoice from [employer]. Please contact [facility] at [phone] with questions or concerns regarding your invoice.   Our billing staff will not be able to assist you with questions regarding bills from these companies.  You will be contacted with the lab results as soon as they are available. The fastest way to get your results is to activate your My Chart account. Instructions are located on the last page of this paperwork. If you have not heard from [place] regarding the results in 2 weeks, please contact this office.

## 2016-01-10 NOTE — Progress Notes (Signed)
Bryan [name] is a 40 y.o. male who presents to [facility] today for left knee pain, left foot pain, right shoulder pain.  Patient notes ongoing left knee pain present for about 2 months. He has previously been evaluated and had x-rays low back and a knee brace that I'll not helped. He notes continued medial knee pain and swelling. He denies any locking or catching or giving way. No specific injury. No fevers or chills. Pain is bothersome enough that he is missing work. He works as a Designer, fashion/clothingroofer.  Additionally patient notes left plantar foot pain. He has pain is predominantly at the plantar calcaneus worse when he first gets out of bed the morning that are with activity. No injury as well. No specific treatment tried yet. Symptoms present for a month or so.  Additionally patient has right shoulder pain. Pain is located in the lateral upper arm and into the trapezius. Pain with overhand motion and reaching back. Symptoms present about 2 months with no treatment tried or no evaluation.   Past Medical History  Diagnosis Date  . Chronic back pain    Past Surgical History  Procedure Laterality Date  . Appendectomy     Social History  Substance Use Topics  . Smoking status: Never Smoker   . Smokeless tobacco: Never Used  . Alcohol Use: No   ROS as above Medications: Current Outpatient Prescriptions  Medication Sig Dispense Refill  . cyclobenzaprine (FLEXERIL) 5 MG tablet Take 1 tablet (5 mg total) by mouth 3 (three) times daily as needed for muscle spasms. 60 tablet 1  . docusate sodium (COLACE) 100 MG capsule Take 1 capsule (100 mg total) by mouth daily as needed for mild constipation. 60 capsule 0  . omeprazole (PRILOSEC) 40 MG capsule Take 1 capsule (40 mg total) by mouth daily. 30 minutes before a meal 90 capsule 0  . diclofenac sodium (VOLTAREN) 1 % GEL Apply 4 g topically 4 (four) times daily. To affected joint. 100 g 11  . naproxen (NAPROSYN) 500 MG tablet Take 1 tablet  (500 mg total) by mouth 2 (two) times daily with a meal. 60 tablet 3   No current facility-administered medications for this visit.   No Known Allergies   Exam:  BP 136/80 mmHg  Pulse 98  Temp(Src) 97.6 F (36.4 C)  Resp 16  Ht 5\' 9"  (1.753 m)  Wt 238 lb (107.956 kg)  BMI 35.13 kg/m2  SpO2 99% Gen: Well NAD HEENT: EOMI,  MMM Lungs: Normal work of breathing. CTABL Heart: RRR no MRG Abd: NABS, Soft. Nondistended, Nontender Exts: Brisk capillary refill, warm and well perfused.  Left knee: Mild effusion otherwise normal-appearing. Notion 0-120 with no significant retropatellar crepitation Tender to palpation medial joint line nontender otherwise Lax anterior drawer normal valgus varus stress positive medial McMurray's test Left foot normal-appearing. Tender palpation plantar calcaneus. Normal motion otherwise pulses capillary refill and sensation intact Right shoulder normal-appearing. Tender palpation right trapezius Normal shoulder motion of pain with abduction. Positive Hawkins and Neer's test positive empty can test negative Efrain Sella'Brien Yergason's and speeds test. Pulses capillary refill and sensation intact distally.  Knee injection. Left Consent obtained and timeout performed. Patient seated in a comfortable position with legs hanging off the table.  The medial Peri-patellar tendon space was palpated and marked. The skin was then cleaned with alcohol. Cold spray applied. A 25-gauge inch and a half needle was used to inject 80 mg of Depo-Medrol and 4 mL of lidocaine. Patient  tolerated procedure well no bleeding. Pain improved following injection   No results found for this or any previous visit (from the past 24 hour(s)). No results found.  Assessment and Plan: 40 y.o. male with   1) left knee pain: Likely degenerative medial meniscus tear. X-rays reviewed and are normal previously. Injection today follow-up with me in sports medicine if not improved. 2) left foot pain  plantar fasciitis: Discussed home exercise program and icing program. Follow up with sports medicine. 3) right shoulder pain: Shoulder impingement plus trapezius spasm. Follow up with sports medicine. Home exercise program reviewed.  Discussed warning signs or symptoms. Please see discharge instructions. Patient expresses understanding.

## 2016-05-29 ENCOUNTER — Encounter (HOSPITAL_COMMUNITY): Payer: Self-pay | Admitting: Emergency Medicine

## 2016-05-29 ENCOUNTER — Emergency Department (HOSPITAL_COMMUNITY)
Admission: EM | Admit: 2016-05-29 | Discharge: 2016-05-29 | Disposition: A | Discharge status: Home / Self Care | Payer: Commercial Managed Care - HMO | Attending: Emergency Medicine | Admitting: Emergency Medicine

## 2016-05-29 ENCOUNTER — Emergency Department (HOSPITAL_COMMUNITY): Payer: Commercial Managed Care - HMO

## 2016-05-29 ENCOUNTER — Ambulatory Visit (INDEPENDENT_AMBULATORY_CARE_PROVIDER_SITE_OTHER): Payer: Commercial Managed Care - HMO | Admitting: Family Medicine

## 2016-05-29 VITALS — BP 130/100 | HR 81 | Temp 98.2°F | Resp 18 | Ht 69.5 in | Wt 241.0 lb

## 2016-05-29 DIAGNOSIS — R079 Chest pain, unspecified: Secondary | ICD-10-CM

## 2016-05-29 DIAGNOSIS — F419 Anxiety disorder, unspecified: Secondary | ICD-10-CM | POA: Diagnosis not present

## 2016-05-29 DIAGNOSIS — R0789 Other chest pain: Secondary | ICD-10-CM | POA: Diagnosis not present

## 2016-05-29 LAB — COMPREHENSIVE METABOLIC PANEL
ALBUMIN: 4.4 g/dL (ref 3.5–5.0)
ALT: 40 U/L (ref 17–63)
ANION GAP: 7 (ref 5–15)
AST: 36 U/L (ref 15–41)
Alkaline Phosphatase: 70 U/L (ref 38–126)
BUN: 14 mg/dL (ref 6–20)
CALCIUM: 9 mg/dL (ref 8.9–10.3)
CHLORIDE: 104 mmol/L (ref 101–111)
CO2: 27 mmol/L (ref 22–32)
Creatinine, Ser: 0.91 mg/dL (ref 0.61–1.24)
GFR calc non Af Amer: >60 mL/min (ref >60)
GLUCOSE: 109 mg/dL — AB (ref 65–99)
POTASSIUM: 3.8 mmol/L (ref 3.5–5.1)
SODIUM: 138 mmol/L (ref 135–145)
Total Bilirubin: 1.6 mg/dL — ABNORMAL HIGH (ref 0.3–1.2)
Total Protein: 6.8 g/dL (ref 6.5–8.1)

## 2016-05-29 LAB — CBC WITH DIFFERENTIAL/PLATELET
BASOS PCT: 0 %
Basophils Absolute: 0 10*3/uL (ref 0.0–0.1)
EOS ABS: 0.2 10*3/uL (ref 0.0–0.7)
EOS PCT: 3 %
HCT: 42.5 % (ref 39.0–52.0)
HEMOGLOBIN: 15.1 g/dL (ref 13.0–17.0)
LYMPHS ABS: 2.9 10*3/uL (ref 0.7–4.0)
Lymphocytes Relative: 47 %
MCH: 32.8 pg (ref 26.0–34.0)
MCHC: 35.5 g/dL (ref 30.0–36.0)
MCV: 92.4 fL (ref 78.0–100.0)
MONOS PCT: 6 %
Monocytes Absolute: 0.4 10*3/uL (ref 0.1–1.0)
NEUTROS PCT: 44 %
Neutro Abs: 2.7 10*3/uL (ref 1.7–7.7)
PLATELETS: 229 10*3/uL (ref 150–400)
RBC: 4.6 MIL/uL (ref 4.22–5.81)
RDW: 12.4 % (ref 11.5–15.5)
WBC: 6.1 10*3/uL (ref 4.0–10.5)

## 2016-05-29 LAB — I-STAT TROPONIN, ED
TROPONIN I, POC: 0 ng/mL (ref 0.00–0.08)
Troponin i, poc: 0 ng/mL (ref 0.00–0.08)

## 2016-05-29 LAB — D-DIMER, QUANTITATIVE (NOT AT ARMC)

## 2016-05-29 MED ORDER — NITROGLYCERIN 0.3 MG SL SUBL
0.4000 mg | SUBLINGUAL_TABLET | SUBLINGUAL | Status: DC | PRN
  Administered 2016-05-29: 0.3 mg via SUBLINGUAL

## 2016-05-29 MED ORDER — LORAZEPAM 0.5 MG PO TABS
0.5000 mg | ORAL_TABLET | Freq: Once | ORAL | Status: AC
  Administered 2016-05-29: 0.5 mg via ORAL
  Filled 2016-05-29: qty 1

## 2016-05-29 MED ORDER — ASPIRIN 81 MG PO CHEW
324.0000 mg | CHEWABLE_TABLET | Freq: Once | ORAL | Status: AC
  Administered 2016-05-29: 324 mg via ORAL

## 2016-05-29 NOTE — Progress Notes (Signed)
Subjective:  By signing my name below, I, Bryan [name], attest that this documentation has been prepared under the direction and in the presence of Norberto SorensonEva Abdoul Encinas, MD.  Electronically Signed: Andrew Auaven [name], ED Scribe. 05/29/2016. 8:33 AM.   Patient ID: Bryan [name], male    DOB: [birth year], 40 y.o.   MRN: [id number]  HPI Chief Complaint  Patient presents with  . Chest Pain    feel the chest tight on Lt side x 3 days  . Shortness of Breath  . Hypertension  . Medication Refill    omeprazole    HPI Comments: Bryan [name] is a 40 y.o. male who presents to the [facility] complaining of intermittent chest pain, described as pressure, that began 3 days ago. States pain started after eating lunch at work a few days ago; He developed chest pressure with associated diaphoresis, palpations, headaches and SOB, feeling as if he can't take a deep breath. Pain comes and goes, and will last about 10 minutes. Pain can be triggered after eating sometimes and with exertion but also develops pain at rest. He denies radiating pain to neck, back and jaw. Pt tried tylenol and aspirin with relief to pain.  Pt has not taken any medications today. No family hx of cardiac disease.    Patient Active Problem List   Diagnosis Date Noted  . Left knee pain 01/10/2016  . Plantar fasciitis of left foot 01/10/2016  . Right shoulder pain 01/10/2016  . Constipation 12/17/2011   Past Medical History:  Diagnosis Date  . Chronic back pain    Past Surgical History:  Procedure Laterality Date  . APPENDECTOMY     No Known Allergies Prior to Admission medications   Medication Sig Start Date End Date Taking? Authorizing Provider  omeprazole (PRILOSEC) 40 MG capsule Take 1 capsule (40 mg total) by mouth daily. 30 minutes before a meal 12/27/15  Yes Sherren MochaEva N Lynnel Zanetti, MD  diclofenac sodium (VOLTAREN) 1 % GEL Apply 4 g topically 4 (four) times daily. To affected joint. Patient not taking:  Reported on 05/29/2016 01/10/16   Rodolph BongEvan S Corey, MD  docusate sodium (COLACE) 100 MG capsule Take 1 capsule (100 mg total) by mouth daily as needed for mild constipation. Patient not taking: Reported on 05/29/2016 10/04/15   Elvina SidleKurt Lauenstein, MD  naproxen (NAPROSYN) 500 MG tablet Take 1 tablet (500 mg total) by mouth 2 (two) times daily with a meal. Patient not taking: Reported on 05/29/2016 01/10/16   Rodolph BongEvan S Corey, MD   Social History   Social History  . Marital status: Single    Spouse name: N/A  . Number of children: N/A  . Years of education: N/A   Occupational History  . Not on file.   Social History Main Topics  . Smoking status: Never Smoker  . Smokeless tobacco: Never Used  . Alcohol use No  . Drug use: No  . Sexual activity: No   Other Topics Concern  . Not on file   Social History Narrative  . No narrative on file      Review of Systems  Constitutional: Positive for diaphoresis.  Respiratory: Positive for shortness of breath.   Cardiovascular: Positive for chest pain and palpitations.  Gastrointestinal: Negative for nausea.  Musculoskeletal: Negative for back pain and neck pain.  Neurological: Positive for headaches.   Objective:   Physical Exam  Constitutional: He is oriented to person, place, and time. He appears well-developed and well-nourished.  No distress.  HENT:  Head: Normocephalic and atraumatic.  Eyes: Conjunctivae and EOM are normal.  Neck: Neck supple.  Cardiovascular: Normal rate, regular rhythm and normal heart sounds.   No murmur heard. Pulmonary/Chest: Effort normal and breath sounds normal. He has no wheezes. He has no rales.  Musculoskeletal: Normal range of motion.  Neurological: He is alert and oriented to person, place, and time.  Skin: Skin is warm and dry.  Psychiatric: He has a normal mood and affect. His behavior is normal.  Nursing note and vitals reviewed.  Vitals:   05/29/16 0807  BP: (!) 130/100  Pulse: 81  Resp: 18  Temp:  98.2 F (36.8 C)  TempSrc: Oral  SpO2: 97%  Weight: 241 lb (109.3 kg)  Height: 5' 9.5" (1.765 m)    8:24 AM- chewed aspirin 324 mg and given nitroglycerin  0.3 mg SL x 1, within 5 minutes pain relieved resolved.  8:36 AM- EMS called for transport 8:46 AM- EMS arrived  EKG normal sinus rhythm no ischemic changes.   Assessment & Plan:   1. Chest pain, unspecified type   Concern for unstable angina so [facility]. Fortunately, pt w/o EKG changes or risk factors for CAD so ddx inc gerd, esophageal spasm, cholelithiasis, panic attacks. However, as sxs are left-sided chest pressure occurred initially after eating and now sev times/d with activity and is associated at times with diaphoresis, shortness of breath, and radiation to neck or back, lasts for 5-10 min and resolves with rest def need to r/o ACS with cycling troponins. EKG today was obtained WHILE pt was having the chest pressure and pain WAS relieved after asa 324mg  and nitro 0.4 SL x 1. Once I advised pt that we need to transfer to ER by EMS he became VERY anxious and tearful. Orders Placed This Encounter  Procedures  . EKG 12-Lead    Meds ordered this encounter  Medications  . aspirin chewable tablet 324 mg  . nitroGLYCERIN (NITROSTAT) SL tablet 0.3 mg   I personally performed the services described in this documentation, which was scribed in my presence. The recorded information has been reviewed and considered, and addended by me as needed.   Norberto SorensonEva Miami Latulippe, M.D.  [facility]  [place] [address] [place], [place] [address] [phone] phone [phone] fax  05/31/16 10:58 PM

## 2016-05-29 NOTE — ED Notes (Signed)
ED Provider at bedside. 

## 2016-05-29 NOTE — ED Notes (Signed)
Called lab and verified add on of D-Dimer

## 2016-05-29 NOTE — ED Triage Notes (Signed)
BIB [facility] from Urgent care where patient went in for intermittent chest pain X3 days. Urgent care gave 324 of aspirin and 1 NItro at 0825. Pt. Denies pain with EMS and at bedside. Pt. Reports feeling worried and claustrophobic. Denies SOB and nausea.

## 2016-05-29 NOTE — ED Notes (Signed)
Pt. Verbalized understaning of need to follow up with PCP. No questions. NAD. Ambulatory on discharge.

## 2016-05-29 NOTE — ED Provider Notes (Signed)
[facility]-EMERGENCY DEPT Provider Note   CSN: [id number] Arrival date & time: 05/29/16  [id number]     History   Chief Complaint Chief Complaint  Patient presents with  . Chest Pain    HPI Bryan [name] is a 40 y.o. male.  HPI Bryan [name] is a 40 y.o. male with no medical problems, presents to ED with complaint of chest pain. Pt has had intermittent left sided chest "pressure" for the last 3 days. States pain comes and goes. States had one episode of diaphoresis 2 days ago associated with pressure. Denies dizziness, light headiness, nausea, vomiting. No abdominal pain. Pain is non exertional. States he has tried drinking lemon water and taking aspirin at home with no relief. Pt stats pain is worse at work. 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 Admits to feeling "anxious." But states he is not sure if anxiety is because of chest pain or if his chest pain is caused by anxiety. Went to UC today and transferred here. Given 324mg  aspirin and 1SL nitro which did not help. Pt is currently pain free.   Past Medical History:  Diagnosis Date  . Chronic back pain     Patient Active Problem List   Diagnosis Date Noted  . Left knee pain 01/10/2016  . Plantar fasciitis of left foot 01/10/2016  . Right shoulder pain 01/10/2016  . Constipation 12/17/2011    Past Surgical History:  Procedure Laterality Date  . APPENDECTOMY         Home Medications    Prior to Admission medications   Medication Sig Start Date End Date Taking? Authorizing Provider  docusate sodium (COLACE) 100 MG capsule Take 1 capsule (100 mg total) by mouth daily as needed for mild constipation. Patient not taking: Reported on 05/29/2016 10/04/15   Elvina SidleKurt Lauenstein, MD  omeprazole (PRILOSEC) 40 MG capsule Take 1 capsule (40 mg total) by mouth daily. 30 minutes before a meal 12/27/15   Sherren MochaEva N Shaw, MD    Family History Family History  Problem Relation Age of Onset  . Cancer Mother   . Diabetes Father      Social History Social History  Substance Use Topics  . Smoking status: Never Smoker  . Smokeless tobacco: Never Used  . Alcohol use No     Allergies   Review of patient's allergies indicates no known allergies.   Review of Systems Review of Systems  Constitutional: Positive for diaphoresis. Negative for chills and fever.  Respiratory: Positive for chest tightness. Negative for cough and shortness of breath.   Cardiovascular: Positive for chest pain. Negative for palpitations and leg swelling.  Gastrointestinal: Negative for abdominal distention, abdominal pain, diarrhea, nausea and vomiting.  Genitourinary: Negative for dysuria, frequency, hematuria and urgency.  Musculoskeletal: Negative for arthralgias, myalgias, neck pain and neck stiffness.  Skin: Negative for rash.  Allergic/Immunologic: Negative for immunocompromised state.  Neurological: Negative for dizziness, weakness, light-headedness, numbness and headaches.  All other systems reviewed and are negative.    Physical Exam Updated Vital Signs BP 129/89 (BP Location: Right Arm)   Pulse 83   Temp 98.2 F (36.8 C) (Oral)   Resp 16   Ht 5\' 10"  (1.778 m)   Wt 108.9 kg   SpO2 99%   BMI 34.44 kg/m   Physical Exam  Constitutional: He appears well-developed and well-nourished.  HENT:  Head: Normocephalic and atraumatic.  Eyes: Conjunctivae are normal.  Neck: Neck supple.  Cardiovascular: Normal rate, regular rhythm and normal heart  sounds.   Pulmonary/Chest: Effort normal. No respiratory distress. He has no wheezes. He has no rales. He exhibits no tenderness.  Abdominal: Soft. Bowel sounds are normal. He exhibits no distension. There is no tenderness. There is no rebound.  Musculoskeletal: He exhibits no edema.  Neurological: He is alert.  Skin: Skin is warm and dry.  Psychiatric:  Anxious appearing, tearful  Nursing note and vitals reviewed.    ED Treatments / Results  Labs (all labs ordered are  listed, but only abnormal results are displayed) Labs Reviewed  COMPREHENSIVE METABOLIC PANEL - Abnormal; Notable for the following:       Result Value   Glucose, Bld 109 (*)    Total Bilirubin 1.6 (*)    All other components within normal limits  CBC WITH DIFFERENTIAL/PLATELET  D-DIMER, QUANTITATIVE (NOT AT [facility])  Rosezena SensorI-STAT TROPOININ, ED  I-STAT TROPOININ, ED    EKG  EKG Interpretation  Date/Time:  Saturday May 29 2016 09:23:13 EDT Ventricular Rate:  83 PR Interval:    QRS Duration: 97 QT Interval:  362 QTC Calculation: 426 R Axis:   75 Text Interpretation:  Sinus rhythm ST elev, probable normal early repol pattern Confirmed by Ranae PalmsYELVERTON  MD, DAVID ([id number]) on 05/29/2016 9:26:19 AM       Radiology Dg Chest 2 View  Result Date: 05/29/2016 CLINICAL DATA:  Intermittent chest pain EXAM: CHEST  2 VIEW COMPARISON:  10/29/2012 FINDINGS: The heart size and mediastinal contours are within normal limits. Both lungs are clear. The visualized skeletal structures are unremarkable. IMPRESSION: No active cardiopulmonary disease. Electronically Signed   By: Judie PetitM.  Shick M.D.   On: 05/29/2016 09:53    Procedures Procedures (including critical care time)  Medications Ordered in ED Medications - No data to display   Initial Impression / Assessment and Plan / ED Course  I have reviewed the triage vital signs and the nursing notes.  Pertinent labs & imaging results that were available during my care of the patient were reviewed by me and considered in my medical decision making (see chart for details).  Clinical Course   Patient emergency department with chest pain that has been going on for 3 days. It is nonexertional, sometimes occurring after eating, sometimes at work. Patient appears to be very anxious and is tearful. Will check EKG, labs, chest x-ray.   Initial troponin and blood work all normal. Patient continues to be tearful, he looks very anxious. Will give her an Ativan and get  a d-dimer and delta troponin.   1:36 PM Delta troponin and d-dimer negative. Vital signs are normal. Patient feels much better after the Ativan. Question anxiety. Patient will need close follow-up. Patient states his [facility]. We'll have him follow-up with them. Patient has no risk factors for coronary disease. He is low risk, and stable for discharge home with outpatient follow-up.  Vitals:   05/29/16 1200 05/29/16 1245 05/29/16 1315 05/29/16 1335  BP: 151/95 134/90 133/89 120/80  Pulse: 98  89 83  Resp:  17 15 14   Temp:      TempSrc:      SpO2: 98%  99% 97%  Weight:      Height:        Final Clinical Impressions(s) / ED Diagnoses   Final diagnoses:  Atypical chest pain  Anxiety    New Prescriptions New Prescriptions   No medications on file     Jaynie Crumbleatyana Loreena Valeri, PA-C 05/29/16 1340  Loren Racer, MD 05/29/16 1407

## 2016-05-29 NOTE — Discharge Instructions (Signed)
Take Tylenol or Motrin for pain. Avoid stressors or anxiety. Please follow up with the family doctor.

## 2016-05-29 NOTE — ED Notes (Signed)
Pt. Reports chest pain and feeling worried. MD notified

## 2016-05-29 NOTE — ED Notes (Signed)
Pt. States that Ativan helped his chest pressure.

## 2016-05-29 NOTE — Patient Instructions (Signed)
     IF you received an x-ray today, you will receive an invoice from [facility]. Please contact [facility] at [phone] with questions or concerns regarding your invoice.   IF you received labwork today, you will receive an invoice from [facility]. Please contact [facility] at [phone] with questions or concerns regarding your invoice.   Our billing staff will not be able to assist you with questions regarding bills from these companies.  You will be contacted with the lab results as soon as they are available. The fastest way to get your results is to activate your My Chart account. Instructions are located on the last page of this paperwork. If you have not heard from us regarding the results in 2 weeks, please contact this office.      

## 2016-10-08 ENCOUNTER — Ambulatory Visit (INDEPENDENT_AMBULATORY_CARE_PROVIDER_SITE_OTHER): Payer: Commercial Managed Care - HMO | Admitting: Family Medicine

## 2016-10-08 ENCOUNTER — Ambulatory Visit (INDEPENDENT_AMBULATORY_CARE_PROVIDER_SITE_OTHER): Payer: Commercial Managed Care - HMO

## 2016-10-08 VITALS — BP 140/92 | HR 100 | Temp 98.4°F | Resp 16 | Ht 70.0 in | Wt 251.0 lb

## 2016-10-08 DIAGNOSIS — Z23 Encounter for immunization: Secondary | ICD-10-CM | POA: Diagnosis not present

## 2016-10-08 DIAGNOSIS — Z6836 Body mass index (BMI) 36.0-36.9, adult: Secondary | ICD-10-CM | POA: Diagnosis not present

## 2016-10-08 DIAGNOSIS — Z125 Encounter for screening for malignant neoplasm of prostate: Secondary | ICD-10-CM | POA: Diagnosis not present

## 2016-10-08 DIAGNOSIS — M25511 Pain in right shoulder: Secondary | ICD-10-CM | POA: Diagnosis not present

## 2016-10-08 DIAGNOSIS — E6609 Other obesity due to excess calories: Secondary | ICD-10-CM

## 2016-10-08 DIAGNOSIS — M25512 Pain in left shoulder: Secondary | ICD-10-CM

## 2016-10-08 DIAGNOSIS — Z0000 Encounter for general adult medical examination without abnormal findings: Secondary | ICD-10-CM | POA: Diagnosis not present

## 2016-10-08 DIAGNOSIS — E559 Vitamin D deficiency, unspecified: Secondary | ICD-10-CM | POA: Diagnosis not present

## 2016-10-08 DIAGNOSIS — Z833 Family history of diabetes mellitus: Secondary | ICD-10-CM | POA: Insufficient documentation

## 2016-10-08 MED ORDER — NAPROXEN 500 MG PO TABS: 500.0000 mg | ORAL_TABLET | Freq: Two times a day (BID) | ORAL | 6 refills | Status: DC

## 2016-10-08 MED ORDER — OMEPRAZOLE 20 MG PO CPDR: 20.0000 mg | DELAYED_RELEASE_CAPSULE | Freq: Every day | ORAL | 3 refills | Status: DC

## 2016-10-08 NOTE — Progress Notes (Signed)
Chief Complaint  Patient presents with  . Annual Exam    Pt concerned of Cancer  . Constipation  . Flu Vaccine    Subjective:  Bryan [name] is a 41 y.o. male here for a health maintenance visit.  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  He does not take anything for pain   Patient Active Problem List   Diagnosis Date Noted  . Family history of diabetes mellitus 10/08/2016  . Left knee pain 01/10/2016  . Plantar fasciitis of left foot 01/10/2016  . Right shoulder pain 01/10/2016  . Constipation 12/17/2011    Past Medical History:  Diagnosis Date  . Chronic back pain     Past Surgical History:  Procedure Laterality Date  . APPENDECTOMY       Outpatient Medications Prior to Visit  Medication Sig Dispense Refill  . docusate sodium (COLACE) 100 MG capsule Take 1 capsule (100 mg total) by mouth daily as needed for mild constipation. (Patient not taking: Reported on 05/29/2016) 60 capsule 0  . omeprazole (PRILOSEC) 40 MG capsule Take 1 capsule (40 mg total) by mouth daily. 30 minutes before a meal (Patient not taking: Reported on 05/29/2016) 90 capsule 0   Facility-Administered Medications Prior to Visit  Medication Dose Route Frequency Provider Last Rate Last Dose  . nitroGLYCERIN (NITROSTAT) SL tablet 0.3 mg  0.3 mg Sublingual Q5 min PRN Sherren Mocha, MD   0.3 mg at 05/29/16 0825    No Known Allergies   Family History  Problem Relation Age of Onset  . Cancer Mother   . Diabetes Mother   . Diabetes Father      Social History   Social History  . Marital status: Single    Spouse name: N/A  . Number of children: N/A  . Years of education: N/A   Occupational  History  . Not on file.   Social History Main Topics  . Smoking status: Never Smoker  . Smokeless tobacco: Never Used  . Alcohol use No  . Drug use: No  . Sexual activity: No   Other Topics Concern  . Not on file   Social History Narrative  . No narrative on file   History  Alcohol Use No   History  Smoking Status  . Never Smoker  Smokeless Tobacco  . Never Used   History  Drug Use No     Health Maintenance: See under health Maintenance activity for review of completion dates as well. Immunization History  Administered Date(s) Administered  . Influenza,inj,Quad PF,36+ Mos 10/08/2016  . Influenza-Unspecified 04/26/2015  . Tdap 10/08/2016      Depression Screen-PHQ2/9 Depression screen [facility] 2/9 10/08/2016 10/08/2016 05/29/2016 01/10/2016 12/27/2015  Decreased Interest 0 0 0 0 0  Down, Depressed, Hopeless 0 0 0 0 0  PHQ - 2 Score 0 0 0 0 0      Depression Severity and Treatment Recommendations:  0-4= None  5-9= Mild / Treatment: Support, educate to call if worse; return in one month  10-14= Moderate / Treatment: Support, watchful waiting; Antidepressant or Psycotherapy  15-19= Moderately severe / Treatment: Antidepressant OR Psychotherapy  >= 20 = Major depression, severe / Antidepressant  AND Psychotherapy    Review of Systems   Review of Systems  Constitutional: Negative for chills and fever.  HENT: Negative for hearing loss and tinnitus.   Eyes: Negative for blurred vision and double vision.  Respiratory: Negative for cough, hemoptysis, sputum production, shortness of breath and wheezing.   Cardiovascular: Negative for chest pain, palpitations, orthopnea and claudication.  Gastrointestinal: Positive for constipation. Negative for abdominal pain, nausea and vomiting.  Musculoskeletal: Positive for joint pain. Negative for falls.       +bilateral heel pain  Skin: Negative for itching and rash.  Neurological: Negative for dizziness and headaches.    Psychiatric/Behavioral: Negative for depression. The patient is not nervous/anxious and does not have insomnia.     See HPI for ROS as well.    Objective:   Vitals:   10/08/16 0836  BP: (!) 140/92  Pulse: 100  Resp: 16  Temp: 98.4 F (36.9 C)  TempSrc: Oral  SpO2: 98%  Weight: 251 lb (113.9 kg)  Height: 5\' 10"  (1.778 m)    Body mass index is 36.01 kg/m.  Physical Exam  Constitutional: He is oriented to person, place, and time. He appears well-developed and well-nourished.  HENT:  Head: Normocephalic and atraumatic.  Right Ear: External ear normal.  Left Ear: External ear normal.  Nose: Nose normal.  Mouth/Throat: Oropharynx is clear and moist. No oropharyngeal exudate.  Eyes: Conjunctivae and EOM are normal. Right eye exhibits no discharge. Left eye exhibits no discharge.  Neck: Normal range of motion. Neck supple. No thyromegaly present.  Cardiovascular: Normal rate, regular rhythm and normal heart sounds.   Pulmonary/Chest: Effort normal and breath sounds normal. No respiratory distress. He has no wheezes. He has no rales.  Abdominal: Soft. Bowel sounds are normal. He exhibits no distension. There is no tenderness. There is no rebound.  Musculoskeletal: Normal range of motion. He exhibits no edema.  Neurological: He is alert and oriented to person, place, and time. He has normal reflexes.  Psychiatric: He has a normal mood and affect. His behavior is normal. Judgment and thought content normal.   Shoulder Exam Pt tender to palpation over the [facility] joint bilaterally With external rotation pt has limited range of motion Abduction normal bilaterally No winged scapula bilaterally No crepitus bilaterally Normal reflexes of the BR bilaterally    Assessment/Plan:   Patient was seen for a health maintenance exam.  Counseled the patient on health maintenance issues. Reviewed her health mainteance schedule and ordered appropriate tests (see orders.) Counseled on regular  exercise and weight management. Recommend regular eye exams and dental cleaning.   The following issues were addressed today for health maintenance:   [name] was seen today for annual exam, constipation and flu vaccine.  Diagnoses and all orders for this visit:  Health maintenance examination- age appropriate screenings and screenings based on family history -     Lipid panel -     PSA -     Basic metabolic panel -     Hemoglobin A1c  Acute pain of both shoulders-  Reviewed xray with pt and advised nsaid naproxen as well as referral to PT -     DG Shoulder Right -     DG Shoulder Left -     Ambulatory referral to Physical Therapy  Screening for prostate cancer- ordered blood test  Need for vaccination -     Flu Vaccine QUAD 36+ mos IM  Vitamin D insufficiency- last levels were low -  VITAMIN D 25 Hydroxy (Vit-D Deficiency, Fractures)  Family history of diabetes mellitus -     Hemoglobin A1c  Class 2 obesity due to excess calories without serious comorbidity with body mass index (BMI) of 36.0 to 36.9 in adult- discussed weight loss -     Hemoglobin A1c  Other orders -     Tdap vaccine greater than or equal to 7yo IM -     omeprazole (PRILOSEC) 20 MG capsule; Take 1 capsule (20 mg total) by mouth daily. -     naproxen (NAPROSYN) 500 MG tablet; Take 1 tablet (500 mg total) by mouth 2 (two) times daily with a meal.    No Follow-up on file.    Body mass index is 36.01 kg/m.:  Discussed the patient's BMI with patient. The BMI body mass index is 36.01 kg/m.     No future appointments.  Patient Instructions       IF you received an x-ray today, you will receive an invoice from [facility]. Please contact [facility] at [phone] with questions or concerns regarding your invoice.   IF you received labwork today, you will receive an invoice from [facility]. Please contact [facility] at [phone] with questions or concerns regarding your invoice.     Our billing staff will not be able to assist you with questions regarding bills from these companies.  You will be contacted with the lab results as soon as they are available. The fastest way to get your results is to activate your My Chart account. Instructions are located on the last page of this paperwork. If you have not heard from us regarding the results in 2 weeks, please contact this office.      Dolor en el hombro (Shoulder Pain) Muchas cosas pueden provocar dolor en el hombro, por ejemplo:  Una lesin en la zona.  El uso excesivo del hombro.  Artritis. La causa del dolor puede ser lo siguiente:  Inflamacin.  Una lesin en la articulacin del hombro.  Una lesin en un tendn, ligamento o hueso. INSTRUCCIONES PARA EL CUIDADO EN EL HOGAR Tome estas medidas para aliviar el dolor:  Apriete una pelota blanda o una almohadilla de goma tanto como sea posible. Esto ayuda e prevenir la hinchazn en el hombro. Tambin ayuda a Medical sales representativefortalecer el brazo.  Tome los medicamentos de venta libre y los recetados solamente como se lo haya indicado el mdico.  Si se lo indican, aplique hielo sobre la zona:  Ponga el hielo en una bolsa plstica.  Coloque una toalla entre la piel y la bolsa de hielo.  Coloque el hielo durante 20minutos, 2 a 3veces por Futures traderda. Deje de aplicar hielo si no ayuda a Engineer, materialsaliviar el dolor.  Si le indicaron que use un cabestrillo o un inmovilizador en el hombro:  selos como se lo hayan indicado.  Quteselos para ducharse o para baarse.  Mueva el brazo lo menos posible, pero mantenga la mano en movimiento para evitar la hinchazn. SOLICITE ATENCIN MDICA SI:  El dolor empeora.  El dolor no se alivia con los United Parcelmedicamentos.  Aparece un dolor nuevo en el brazo, la mano o los dedos. SOLICITE ATENCIN MDICA DE INMEDIATO SI:  El brazo, la mano o los dedos:  Hormiguean.  Se adormecen.  Se hinchan.  Duelen.  Se tornan de color blanco o azul. Esta  informacin no tiene Theme park managercomo fin reemplazar el consejo del mdico. Asegrese de hacerle al mdico cualquier pregunta que tenga. Document Released: 04/21/2005 Document Revised: 11/03/2015 Document  Reviewed: 11/04/2014 Elsevier Interactive Patient Education  2017 [employer].

## 2016-10-08 NOTE — Patient Instructions (Addendum)
     IF you received an x-ray today, you will receive an invoice from [facility]. Please contact [facility] at [phone] with questions or concerns regarding your invoice.   IF you received labwork today, you will receive an invoice from [facility]. Please contact [facility] at [phone] with questions or concerns regarding your invoice.   Our billing staff will not be able to assist you with questions regarding bills from these companies.  You will be contacted with the lab results as soon as they are available. The fastest way to get your results is to activate your My Chart account. Instructions are located on the last page of this paperwork. If you have not heard from us regarding the results in 2 weeks, please contact this office.      Dolor en el hombro (Shoulder Pain) Muchas cosas pueden provocar dolor en el hombro, por ejemplo:  Una lesin en la zona.  El uso excesivo del hombro.  Artritis. La causa del dolor puede ser lo siguiente:  Inflamacin.  Una lesin en la articulacin del hombro.  Una lesin en un tendn, ligamento o hueso. INSTRUCCIONES PARA EL CUIDADO EN EL HOGAR Tome estas medidas para aliviar el dolor:  Apriete una pelota blanda o una almohadilla de goma tanto como sea posible. Esto ayuda e prevenir la hinchazn en el hombro. Tambin ayuda a Medical sales representativefortalecer el brazo.  Tome los medicamentos de venta libre y los recetados solamente como se lo haya indicado el mdico.  Si se lo indican, aplique hielo sobre la zona:  Ponga el hielo en una bolsa plstica.  Coloque una toalla entre la piel y la bolsa de hielo.  Coloque el hielo durante 20minutos, 2 a 3veces por Futures traderda. Deje de aplicar hielo si no ayuda a Engineer, materialsaliviar el dolor.  Si le indicaron que use un cabestrillo o un inmovilizador en el hombro:  selos como se lo hayan indicado.  Quteselos para ducharse o para baarse.  Mueva el brazo lo menos posible, pero mantenga la mano en  movimiento para evitar la hinchazn. SOLICITE ATENCIN MDICA SI:  El dolor empeora.  El dolor no se alivia con los United Parcelmedicamentos.  Aparece un dolor nuevo en el brazo, la mano o los dedos. SOLICITE ATENCIN MDICA DE INMEDIATO SI:  El brazo, la mano o los dedos:  Hormiguean.  Se adormecen.  Se hinchan.  Duelen.  Se tornan de color blanco o azul. Esta informacin no tiene Theme park managercomo fin reemplazar el consejo del mdico. Asegrese de hacerle al mdico cualquier pregunta que tenga. Document Released: 04/21/2005 Document Revised: 11/03/2015 Document Reviewed: 11/04/2014 Elsevier Interactive Patient Education  2017 [employer].

## 2016-10-09 LAB — BASIC METABOLIC PANEL
BUN / CREAT RATIO: 14 (ref 9–20)
BUN: 13 mg/dL (ref 6–24)
CALCIUM: 9.6 mg/dL (ref 8.7–10.2)
CHLORIDE: 96 mmol/L (ref 96–106)
CO2: 26 mmol/L (ref 18–29)
CREATININE: 0.9 mg/dL (ref 0.76–1.27)
GFR calc Af Amer: 122 mL/min/{1.73_m2} (ref >59)
GFR, EST NON AFRICAN AMERICAN: 106 mL/min/{1.73_m2} (ref >59)
Glucose: 116 mg/dL — ABNORMAL HIGH (ref 65–99)
Potassium: 4.4 mmol/L (ref 3.5–5.2)
Sodium: 139 mmol/L (ref 134–144)

## 2016-10-09 LAB — LIPID PANEL
CHOLESTEROL TOTAL: 149 mg/dL (ref 100–199)
Chol/HDL Ratio: 4.4 ratio units (ref 0.0–5.0)
HDL: 34 mg/dL — AB (ref >39)
LDL CALC: 63 mg/dL (ref 0–99)
Triglycerides: 260 mg/dL — ABNORMAL HIGH (ref 0–149)
VLDL CHOLESTEROL CAL: 52 mg/dL — AB (ref 5–40)

## 2016-10-09 LAB — HEMOGLOBIN A1C
ESTIMATED AVERAGE GLUCOSE: 108 mg/dL
HEMOGLOBIN A1C: 5.4 % (ref 4.8–5.6)

## 2016-10-09 LAB — PSA: Prostate Specific Ag, Serum: 0.7 ng/mL (ref 0.0–4.0)

## 2016-10-09 LAB — VITAMIN D 25 HYDROXY (VIT D DEFICIENCY, FRACTURES): VIT D 25 HYDROXY: 18.5 ng/mL — AB (ref 30.0–100.0)

## 2016-11-05 ENCOUNTER — Ambulatory Visit: Payer: Commercial Managed Care - HMO | Attending: Family Medicine | Admitting: Physical Therapy

## 2016-11-09 ENCOUNTER — Telehealth: Payer: Self-pay | Admitting: Physical Therapy

## 2016-11-09 NOTE — Telephone Encounter (Signed)
11/05/16 no show for PT eval

## 2016-11-25 ENCOUNTER — Encounter: Payer: Self-pay | Admitting: Family Medicine

## 2016-12-16 ENCOUNTER — Telehealth: Payer: Self-pay | Admitting: Physical Therapy

## 2016-12-16 NOTE — Telephone Encounter (Signed)
11/05/16 no show for PT eval 12/16/16 attempted to call, unable to leave message

## 2017-02-03 DIAGNOSIS — R079 Chest pain, unspecified: Secondary | ICD-10-CM | POA: Diagnosis not present

## 2017-02-03 DIAGNOSIS — R000 Tachycardia, unspecified: Secondary | ICD-10-CM | POA: Diagnosis not present

## 2017-02-03 DIAGNOSIS — R05 Cough: Secondary | ICD-10-CM | POA: Diagnosis not present

## 2017-02-05 ENCOUNTER — Encounter: Payer: Self-pay | Admitting: Emergency Medicine

## 2017-02-05 ENCOUNTER — Ambulatory Visit (INDEPENDENT_AMBULATORY_CARE_PROVIDER_SITE_OTHER): Payer: 59 | Admitting: Emergency Medicine

## 2017-02-05 VITALS — BP 139/88 | HR 80 | Temp 98.8°F | Resp 16 | Ht 70.0 in | Wt 236.0 lb

## 2017-02-05 DIAGNOSIS — I10 Essential (primary) hypertension: Secondary | ICD-10-CM | POA: Diagnosis not present

## 2017-02-05 MED ORDER — LISINOPRIL 10 MG PO TABS: 10.0000 mg | ORAL_TABLET | Freq: Every day | ORAL | 3 refills | Status: DC

## 2017-02-05 NOTE — Patient Instructions (Signed)
Plan de alimentacin DASH (DASH Eating Plan) DASH es la sigla en ingls de "Enfoques Alimentarios para Detener la Hipertensin". El plan de alimentacin DASH ha demostrado bajar la presin arterial elevada (hipertensin). Los beneficios adicionales para la salud pueden incluir la disminucin del riesgo de diabetes mellitus tipo2, enfermedades cardacas e ictus. Este plan tambin puede ayudar a adelgazar. QU DEBO SABER ACERCA DEL PLAN DE ALIMENTACIN DASH? Para el plan de alimentacin DASH, seguir las siguientes pautas generales:  Elija los alimentos que contienen menos de 150 miligramos de sodio por porcin (segn se indica en la etiqueta de los alimentos).  Use hierbas o aderezos sin sal, en lugar de sal de mesa o sal marina.  Consulte al mdico o farmacutico antes de usar sustitutos de la sal.  Consuma los productos con menor contenido de sodio. Estos productos suelen estar etiquetados como "bajo en sodio" o "sin agregado de sal".  Coma alimentos frescos. No consuma una gran cantidad de alimentos enlatados.  Coma ms verduras, frutas y productos lcteos con bajo contenido de grasas.  Elija los cereales integrales. Busque la palabra "integral" en el primer lugar de la lista de ingredientes.  Elija el pescado y el pollo o el pavo sin piel ms a menudo que las carnes rojas. Limite el consumo de pescado, carne de ave y carne a 6onzas (170g) por da.  Limite el consumo de dulces, postres, azcares y bebidas azucaradas.  Elija las grasas saludables para el corazn.  Consuma ms comida casera y menos de restaurante, de buf y comida rpida.  Limite el consumo de alimentos fritos.  No fra los alimentos. A la hora de cocinarlos, opte por hornearlos, hervirlos, grillarlos y asarlos a la parrilla.  Cuando coma en un restaurante, pida que preparen su comida con menos sal o, en lo posible, sin nada de sal. QU ALIMENTOS PUEDO COMER? Pida ayuda a un nutricionista para conocer las  necesidades calricas individuales. Cereales Pan de salvado o integral. Arroz integral. Pastas de salvado o integrales. Quinua, trigo burgol y cereales integrales. Cereales con bajo contenido de sodio. Tortillas de harina de maz o de salvado. Pan de maz integral. Galletas saladas integrales. Galletas con bajo contenido de sodio. Vegetales Verduras frescas o congeladas (crudas, al vapor, asadas o grilladas). Jugos de tomate y verduras con contenido bajo o reducido de sodio. Pasta y salsa de tomate con contenido bajo o reducido de sodio. Verduras enlatadas con bajo contenido de sodio o reducido de sodio. Frutas Frutas frescas, en conserva (en su jugo natural) o frutas congeladas. Carnes y otros productos con protenas Carne de res molida (al 85% o ms magra), carne de res de animales alimentados con pastos o carne de res sin la grasa. Pollo o pavo sin piel. Carne de pollo o de pavo molida. Cerdo sin la grasa. Todos los pescados y frutos de mar. Huevos. Porotos, guisantes o lentejas secos. Frutos secos y semillas sin sal. Frijoles enlatados sin sal. Lcteos Productos lcteos con bajo contenido de grasas, como leche descremada o al 1%, quesos reducidos en grasas o al 2%, ricota con bajo contenido de grasas o queso cottage, o yogur natural con bajo contenido de grasas. Quesos con contenido bajo o reducido de sodio. Grasas y aceites Margarinas en barra que no contengan grasas trans. [place] y alios para ensaladas livianos o reducidos en grasas (reducidos en sodio). [place]. Aceites de crtamo, oliva o canola. [place] natural de man o almendra. Otros Palomitas de maz y pretzels sin sal. Los artculos mencionados arriba pueden no   ser [employer] de las bebidas o los alimentos recomendados. Comunquese con el nutricionista para conocer ms opciones. QU ALIMENTOS NO SE RECOMIENDAN? Cereales Pan blanco. Pastas blancas. Arroz blanco. Pan de maz refinado. Bagels y croissants. Galletas  saladas que contengan grasas trans. Vegetales Vegetales con crema o fritos. Verduras en [place]. Verduras enlatadas comunes. Pasta y salsa de tomate en lata comunes. Jugos comunes de tomate y de verduras. Lambert Mody Fruta enlatada en almbar liviano o espeso. Jugo de frutas. Carnes y otros productos con protenas [place] de carne con Lobbyist. Costillas, alas de pollo, tocineta, salchicha, mortadela, salame, chinchulines, tocino, perros calientes, salchichas alemanas y embutidos envasados. Frutos secos y semillas con sal. Frijoles con sal en lata. Lcteos Leche entera o al 2%, crema, mezcla de [place] y crema, y queso crema. Yogur entero o endulzado. Quesos o queso azul con alto contenido de Physicist, medical. [place]remas no lcteas y coberturas batidas. Quesos procesados, quesos para untar o cuajadas. Condimentos Sal de cebolla y ajo, sal condimentada, sal de mesa y sal marina. Salsas en lata y envasadas. Salsa Worcestershire. Salsa trtara. Salsa barbacoa. Salsa teriyaki. Salsa de soja, incluso la que tiene contenido reducido de Motley. Salsa de carne. Salsa de pescado. Salsa de [place]. Salsa rosada. Rbano picante. Ketchup y mostaza. Saborizantes y tiernizantes para carne. Caldo en cubitos. Salsa picante. Salsa tabasco. Adobos. Aderezos para tacos. Salsas. Grasas y aceites [place], [place] en barra, [place] de [place], [place], [place] clarificada y Wendee Copp de tocino. Aceites de coco, de palmiste o de palma. Aderezos comunes para ensalada. Otros Pickles y [place]. Palomitas de maz y pretzels con sal. Los artculos mencionados arriba pueden no ser [employer] de las bebidas y los alimentos que se Higher education careers adviser. Comunquese con el nutricionista para obtener ms informacin. DNDE Dolan Amen MS INFORMACIN? [place], del Pulmn y de Herbalist (National Heart, Lung, and [place]): [email or web address].com Esta informacin no tiene Hydrologist el consejo del mdico. Asegrese de hacerle al mdico cualquier pregunta que tenga. Document Released: 07/01/2011 Document Revised: 11/03/2015 Document Reviewed: 05/16/2013 Elsevier Interactive Patient Education  2017 Elsevier Inc. Hipertensin Hypertension El trmino hipertensin es otra forma de denominar a la presin arterial elevada. La presin arterial elevada fuerza al corazn a trabajar ms para bombear la sangre. Esto puede causar problemas con el paso del [place]. Una lectura de presin arterial est compuesta por 2 nmeros. Hay un nmero superior (sistlico) sobre un nmero inferior (diastlico). Lo ideal es tener la presin arterial por debajo de 120/80. Las decisiones saludables pueden ayudarle a disminuir su presin arterial. Es posible que necesite medicamentos que le ayuden a disminuir su presin arterial si:  Su presin arterial no disminuye mediante decisiones saludables.  Su presin arterial est por encima de 130/80.  Siga estas instrucciones en su casa: Comida y bebida  Si se lo indican, siga el plan de alimentacin de DASH (Dietary Approaches to Stop Hypertension, Maneras de alimentarse para detener la hipertensin). Esta dieta incluye: ? Que la mitad del plato de cada comida sea de frutas y verduras. ? Que un cuarto del plato de cada comida sea de cereales integrales. Los cereales integrales incluyen pasta integral, arroz integral y pan integral. ? Comer y beber productos lcteos con bajo contenido de [place], como leche descremada o yogur bajo en grasas. ? Que un cuarto del plato de cada comida sea de protenas bajas en grasa (magras). Las protenas bajas en grasa incluyen pescado, pollo sin piel, huevos, frijoles y tofu. ?  Evitar consumir carne grasa, carne curada y procesada, o pollo con piel. ? Evitar consumir alimentos prehechos o procesados.  Consuma menos de 1500 mg de sal (sodio) por da.  Limite el consumo de alcohol a no ms de 1 medida por da si es  mujer y no est Orthoptistembarazada y a 2 medidas por da si es hombre. Una medida equivale a 12onzas de cerveza, 5onzas de vino o 1onzas de bebidas alcohlicas de alta graduacin. Estilo de vida  Trabaje con su mdico para mantenerse en un peso saludable o para perder peso. Pregntele a su mdico cul es el peso recomendable para usted.  Realice al menos 30 minutos de ejercicio que haga que se acelere su corazn (ejercicio Magazine features editoraerbico) la DIRECTVmayora de los das de la [place]emana. Estos pueden incluir caminar, nadar o andar en bicicleta.  Realice al menos 30 minutos de ejercicio que fortalezca sus msculos (ejercicios de resistencia) al menos 3 das a la Heuveltonsemana. Estos pueden incluir levantar pesas o hacer pilates.  No consuma ningn producto que contenga nicotina o tabaco. Esto incluye cigarrillos y cigarrillos electrnicos. Si necesita ayuda para dejar de fumar, consulte al American Expressmdico.  Controle su presin arterial en su casa tal como le indic el mdico.  Concurra a todas las visitas de control como se lo haya indicado el mdico. Esto es importante. Medicamentos  Baxter Internationalome los medicamentos de venta libre y los recetados solamente como se lo haya indicado el mdico. Siga cuidadosamente las indicaciones.  No omita las dosis de medicamentos para la presin arterial. Los medicamentos pierden eficacia si omite dosis. El hecho de omitir las dosis tambin Lesothoaumenta el riesgo de otros problemas.  Pregntele a su mdico a qu efectos secundarios o reacciones a los Museum/gallery curatormedicamentos debe prestar atencin. Comunquese con un mdico si:  Piensa que tiene [place] reaccin a los medicamentos que est tomando.  Tiene dolores de cabeza frecuentes (recurrentes).  Siente mareos.  Tiene hinchazn en los tobillos.  Tiene problemas de visin. Solicite ayuda de inmediato si:  Siente un dolor de cabeza muy intenso.  Comienza a sentirse confundido.  Se siente dbil o adormecido.  Siente que va a desmayarse.  Siente un dolor muy  intenso en: ? El pecho. ? El vientre (abdomen).  Devuelve (vomita) ms de una vez.  Tiene dificultad para respirar. Resumen  El trmino hipertensin es otra forma de denominar a la presin arterial elevada.  Las decisiones saludables pueden ayudarle a disminuir su presin arterial. Si no puede controlar su presin arterial mediante decisiones saludables, es posible que deba tomar medicamentos. Esta informacin no tiene Theme park managercomo fin reemplazar el consejo del mdico. Asegrese de hacerle al mdico cualquier pregunta que tenga. Document Released: 12/30/2009 Document Revised: 06/23/2016 Document Reviewed: 06/23/2016 Elsevier Interactive Patient Education  [employer]2018 Elsevier Inc.

## 2017-02-05 NOTE — Progress Notes (Signed)
Bryan [name] 41 y.o.   Chief Complaint  Patient presents with  . Follow-up    Was in hospital for chest pain, HTN    HISTORY OF PRESENT ILLNESS: This is a 41 y.o. male recently in ED with chest pain and found to have elevated BP; asymptomatic now and has no complaints; works long hours and sleeps very little.  HPI   Prior to Admission medications   Medication Sig Start Date End Date Taking? Authorizing Provider  NON FORMULARY    Yes [provider]  omeprazole (PRILOSEC) 20 MG capsule Take 1 capsule (20 mg total) by mouth daily. 10/08/16  Yes Doristine Bosworth, MD  naproxen (NAPROSYN) 500 MG tablet Take 1 tablet (500 mg total) by mouth 2 (two) times daily with a meal. Patient not taking: Reported on 02/05/2017 10/08/16   Doristine Bosworth, MD  [facility] FORMULARY     [provider]  [facility] FORMULARY     [provider]    No Known Allergies  Patient Active Problem List   Diagnosis Date Noted  . Family history of diabetes mellitus 10/08/2016  . Left knee pain 01/10/2016  . Plantar fasciitis of left foot 01/10/2016  . Right shoulder pain 01/10/2016  . Constipation 12/17/2011    Past Medical History:  Diagnosis Date  . Chronic back pain     Past Surgical History:  Procedure Laterality Date  . APPENDECTOMY      Social History   Social History  . Marital status: Single    Spouse name: N/A  . Number of children: N/A  . Years of education: N/A   Occupational History  . Not on file.   Social History Main Topics  . Smoking status: Never Smoker  . Smokeless tobacco: Never Used  . Alcohol use No  . Drug use: No  . Sexual activity: No   Other Topics Concern  . Not on file   Social History Narrative  . No narrative on file    Family History  Problem Relation Age of Onset  . Cancer Mother   . Diabetes Mother   . Diabetes Father      Review of Systems  Constitutional: Negative.  Negative for chills and fever.  HENT:  Negative.  Negative for congestion, nosebleeds and sore throat.   Eyes: Negative.  Negative for blurred vision, double vision, discharge and redness.  Respiratory: Negative.  Negative for cough and shortness of breath.   Cardiovascular: Negative.  Negative for chest pain, palpitations and claudication.  Gastrointestinal: Negative.  Negative for abdominal pain, diarrhea, nausea and vomiting.  Genitourinary: Negative.  Negative for dysuria and hematuria.  Musculoskeletal: Negative.  Negative for back pain, myalgias and neck pain.  Skin: Negative.  Negative for rash.  Neurological: Negative.  Negative for dizziness and headaches.  Endo/Heme/Allergies: Negative.   All other systems reviewed and are negative.  Vitals:   02/05/17 1143  BP: 139/88  Pulse: 80  Resp: 16  Temp: 98.8 F (37.1 C)   BP Readings from Last 3 Encounters:  02/05/17 139/88  10/08/16 (!) 140/92  05/29/16 120/80      Physical Exam  Constitutional: He is oriented to person, place, and time. He appears well-developed and well-nourished.  HENT:  Head: Normocephalic and atraumatic.  Nose: Nose normal.  Mouth/Throat: Oropharynx is clear and moist. No oropharyngeal exudate.  Eyes: Pupils are equal, round, and reactive to light. Conjunctivae and EOM are normal.  Neck: Normal range of motion. Neck supple.  No JVD present. No thyromegaly present.  Cardiovascular: Normal rate, regular rhythm, normal heart sounds and intact distal pulses.   Pulmonary/Chest: Effort normal and breath sounds normal.  Abdominal: Soft. Bowel sounds are normal. He exhibits no distension. There is no tenderness.  Musculoskeletal: Normal range of motion.  Lymphadenopathy:    He has no cervical adenopathy.  Neurological: He is alert and oriented to person, place, and time. He displays normal reflexes. No sensory deficit. He exhibits normal muscle tone.  Skin: Skin is warm and dry. Capillary refill takes less than 2 seconds. No rash noted.    Psychiatric: He has a normal mood and affect. His behavior is normal.  Vitals reviewed.    ASSESSMENT & PLAN: Lars MageJuan was seen today for follow-up.  Diagnoses and all orders for this visit:  Essential (primary) hypertension  Other orders -     lisinopril (PRINIVIL,ZESTRIL) 10 MG tablet; Take 1 tablet (10 mg total) by mouth daily.    Patient Instructions  Plan de alimentacin DASH (DASH Eating Plan) DASH es la sigla en ingls de "Enfoques Alimentarios para Detener la Hipertensin". El plan de alimentacin DASH ha demostrado bajar la presin arterial elevada (hipertensin). Los beneficios adicionales para la salud pueden incluir la disminucin del riesgo de diabetes mellitus tipo2, enfermedades cardacas e ictus. Este plan tambin puede ayudar a Geophysical data processoradelgazar. QU DEBO SABER ACERCA DEL PLAN DE ALIMENTACIN DASH? Para el plan de alimentacin DASH, seguir las siguientes pautas generales:  Elija los alimentos que contienen menos de 150 miligramos de sodio por porcin (segn se indica en la etiqueta de los alimentos).  Use hierbas o aderezos sin sal, en lugar de sal de mesa o sal marina.  Consulte al mdico o farmacutico antes de usar sustitutos de la sal.  Consuma los productos con menor contenido de sodio. Estos productos suelen estar etiquetados como "bajo en sodio" o "sin agregado de sal".  Coma alimentos frescos. No consuma una gran cantidad de alimentos enlatados.  Coma ms verduras, frutas y productos lcteos con bajo contenido de [place] Citygrasas.  Elija los cereales integrales. Busque la palabra "integral" en Estate agentel primer lugar de la lista de ingredientes.  Elija el pescado y el pollo o el pavo sin piel ms a menudo que las carnes rojas. Limite el consumo de pescado, carne de ave y carne a 6onzas (170g) por Futures traderda.  Limite el consumo de dulces, postres, azcares y bebidas azucaradas.  Elija las grasas saludables para el corazn.  Consuma ms comida casera y menos de restaurante, de  buf y comida rpida.  Limite el consumo de alimentos fritos.  No fra los alimentos. A la hora de cocinarlos, opte por hornearlos, hervirlos, grillarlos y asarlos a Patent attorneyla parrilla.  Cuando coma en un restaurante, pida que preparen su comida con menos sal o, en lo posible, sin nada de sal. QU ALIMENTOS PUEDO COMER? Pida ayuda a un nutricionista para conocer las necesidades calricas individuales. Cereales Pan de salvado o integral. Arroz integral. Pastas de salvado o integrales. Quinua, trigo burgol y cereales integrales. Cereales con bajo contenido de sodio. Tortillas de harina de maz o de salvado. Pan de maz integral. Galletas saladas integrales. Galletas con bajo contenido de Calzadasodio. Vegetales Verduras frescas o congeladas (crudas, al vapor, asadas o grilladas). Jugos de tomate y verduras con contenido bajo o reducido de sodio. Pasta y salsa de tomate con contenido bajo o reducido de sodio. Verduras enlatadas con bajo contenido de sodio o reducido de sodio. Nils PyleFrutas Nils PyleFrutas frescas, en conserva (en su Marcell Angerjugo  natural) o frutas congeladas. Carnes y otros productos con protenas Carne de res molida (al 85% o ms [place]), carne de res de animales alimentados con pastos o carne de res sin la grasa. Pollo o pavo sin piel. Carne de pollo o de [place]. Cerdo sin la grasa. Todos los pescados y frutos de mar. Huevos. Porotos, guisantes o lentejas secos. Frutos secos y semillas sin sal. Frijoles enlatados sin sal. Lcteos Productos lcteos con bajo contenido de grasas, como Donna o al 1%, quesos reducidos en grasas o al 2%, ricota con bajo contenido de grasas o [employer], o yogur natural con bajo contenido de [place]. Quesos con contenido bajo o reducido de sodio. Grasas y Writer en barra que no contengan grasas trans. [place] y alios para ensaladas livianos o reducidos en grasas (reducidos en sodio). [place]. Aceites de crtamo, oliva o canola. Mantequilla natural de man o  almendra. Otros Palomitas de maz y pretzels sin sal. Los artculos mencionados arriba pueden no ser [employer]theon de las bebidas o los alimentos recomendados. Comunquese con el nutricionista para conocer ms opciones. QU ALIMENTOS NO SE RECOMIENDAN? Cereales Pan blanco. Pastas blancas. Arroz blanco. Pan de maz refinado. Bagels y croissants. Galletas saladas que contengan grasas trans. Vegetales Vegetales con crema o fritos. Verduras en salsa de [place]. Verduras enlatadas comunes. Pasta y salsa de tomate en lata comunes. Jugos comunes de tomate y de verduras. Nils Pyle Fruta enlatada en almbar liviano o espeso. Jugo de frutas. Carnes y otros productos con protenas [place] de carne con Holiday representative. Costillas, alas de pollo, tocineta, salchicha, mortadela, salame, chinchulines, tocino, perros calientes, salchichas alemanas y embutidos envasados. Frutos secos y semillas con sal. Frijoles con sal en lata. Lcteos Leche entera o al 2%, crema, mezcla de [place] y crema, y queso crema. Yogur entero o endulzado. Quesos o queso azul con alto contenido de Neurosurgeon. Cremas no lcteas y coberturas batidas. Quesos procesados, quesos para untar o cuajadas. Condimentos Sal de cebolla y ajo, sal condimentada, sal de mesa y sal marina. Salsas en lata y envasadas. Salsa [place]. Salsa trtara. Salsa barbacoa. Salsa teriyaki. Salsa de soja, incluso la que tiene contenido reducido de McDonald. Salsa de carne. Salsa de pescado. Salsa de [place]. Salsa rosada. Rbano picante. Ketchup y mostaza. Saborizantes y tiernizantes para carne. Caldo en cubitos. Salsa picante. Salsa tabasco. Adobos. Aderezos para tacos. Salsas. Grasas y [address], [place] en barra, [place] de [place], [place], [place] clarificada y Steffanie Rain[place] de tocino. Aceites de coco, de palmiste o de palma. Aderezos comunes para ensalada. Otros Pickles y [place]. Palomitas de maz y pretzels con sal. Los artculos mencionados arriba pueden no ser  Raytheon de las bebidas y los alimentos que se Theatre stage manager. Comunquese con el nutricionista para obtener ms informacin. DNDE Raelyn Mora MS INFORMACIN? Instituto Nacional del [facility], del Pulmn y de Risk manager (National Heart, Lung, and Blood Institute): CablePromo.it Esta informacin no tiene Theme park manager el consejo del mdico. Asegrese de hacerle al mdico cualquier pregunta que tenga. Document Released: 07/01/2011 Document Revised: 11/03/2015 Document Reviewed: 05/16/2013 Elsevier Interactive Patient Education  2017 Elsevier Inc. Hipertensin Hypertension El trmino hipertensin es otra forma de denominar a la presin arterial elevada. La presin arterial elevada fuerza al corazn a trabajar ms para bombear la sangre. Esto puede causar problemas con el paso del [place]. Una lectura de presin arterial est compuesta por 2 nmeros. Hay un nmero superior (sistlico) sobre un nmero inferior (diastlico). Lo ideal es Occidental Petroleum presin arterial por  debajo de 120/80. Las decisiones saludables pueden ayudarle a disminuir su presin arterial. Es posible que necesite medicamentos que le ayuden a disminuir su presin arterial si:  Su presin arterial no disminuye mediante decisiones saludables.  Su presin arterial est por encima de 130/80.  Siga estas instrucciones en su casa: Comida y bebida  Si se lo indican, siga el plan de alimentacin de DASH (Dietary Approaches to Stop Hypertension, Maneras de alimentarse para detener la hipertensin). Esta dieta incluye: ? Que la mitad del plato de cada comida sea de frutas y verduras. ? Que un cuarto del plato de cada comida sea de cereales integrales. Los cereales integrales incluyen pasta integral, arroz integral y pan integral. ? Comer y beber productos lcteos con bajo contenido de [place], como leche descremada o yogur bajo en grasas. ? Que un cuarto del plato de cada comida sea de  protenas bajas en grasa (magras). Las protenas bajas en grasa incluyen pescado, pollo sin piel, huevos, frijoles y tofu. ? Evitar consumir carne grasa, carne curada y procesada, o pollo con piel. ? Evitar consumir alimentos prehechos o procesados.  Consuma menos de 1500 mg de sal (sodio) por da.  Limite el consumo de alcohol a no ms de 1 medida por da si es mujer y no est Orthoptist y a 2 medidas por da si es hombre. Una medida equivale a 12onzas de cerveza, 5onzas de vino o 1onzas de bebidas alcohlicas de alta graduacin. Estilo de vida  Trabaje con su mdico para mantenerse en un peso saludable o para perder peso. Pregntele a su mdico cul es el peso recomendable para usted.  Realice al menos 30 minutos de ejercicio que haga que se acelere su corazn (ejercicio Magazine features editor) la DIRECTV de la [place]. Estos pueden incluir caminar, nadar o andar en bicicleta.  Realice al menos 30 minutos de ejercicio que fortalezca sus msculos (ejercicios de resistencia) al menos 3 das a la [place]. Estos pueden incluir levantar pesas o hacer pilates.  No consuma ningn producto que contenga nicotina o tabaco. Esto incluye cigarrillos y cigarrillos electrnicos. Si necesita ayuda para dejar de fumar, consulte al American Express.  Controle su presin arterial en su casa tal como le indic el mdico.  Concurra a todas las visitas de control como se lo haya indicado el mdico. Esto es importante. Medicamentos  Baxter International de venta libre y los recetados solamente como se lo haya indicado el mdico. Siga cuidadosamente las indicaciones.  No omita las dosis de medicamentos para la presin arterial. Los medicamentos pierden eficacia si omite dosis. El hecho de omitir las dosis tambin [place] el riesgo de otros problemas.  Pregntele a su mdico a qu efectos secundarios o reacciones a los Museum/gallery curator. Comunquese con un mdico si:  Piensa que tiene [place] reaccin a  los medicamentos que est tomando.  Tiene dolores de cabeza frecuentes (recurrentes).  Siente mareos.  Tiene hinchazn en los tobillos.  Tiene problemas de visin. Solicite ayuda de inmediato si:  Siente un dolor de cabeza muy intenso.  Comienza a sentirse confundido.  Se siente dbil o adormecido.  Siente que va a desmayarse.  Siente un dolor muy intenso en: ? El pecho. ? El vientre (abdomen).  Devuelve (vomita) ms de una vez.  Tiene dificultad para respirar. Resumen  El trmino hipertensin es otra forma de denominar a la presin arterial elevada.  Las decisiones saludables pueden ayudarle a disminuir su presin arterial. Si no puede controlar su presin arterial mediante  decisiones saludables, es posible que deba tomar medicamentos. Esta informacin no tiene Theme park manager el consejo del mdico. Asegrese de hacerle al mdico cualquier pregunta que tenga. Document Released: 12/30/2009 Document Revised: 06/23/2016 Document Reviewed: 06/23/2016 Elsevier Interactive Patient Education  2018 Elsevier Inc.      Edwina Barth, MD [facility]

## 2017-02-07 ENCOUNTER — Emergency Department (HOSPITAL_COMMUNITY)
Admission: EM | Admit: 2017-02-07 | Discharge: 2017-02-07 | Disposition: A | Discharge status: Home / Self Care | Payer: 59 | Attending: Emergency Medicine | Admitting: Emergency Medicine

## 2017-02-07 ENCOUNTER — Emergency Department (HOSPITAL_COMMUNITY): Payer: 59

## 2017-02-07 DIAGNOSIS — I10 Essential (primary) hypertension: Secondary | ICD-10-CM | POA: Insufficient documentation

## 2017-02-07 DIAGNOSIS — R072 Precordial pain: Secondary | ICD-10-CM

## 2017-02-07 DIAGNOSIS — Z79899 Other long term (current) drug therapy: Secondary | ICD-10-CM | POA: Diagnosis not present

## 2017-02-07 DIAGNOSIS — R079 Chest pain, unspecified: Secondary | ICD-10-CM | POA: Diagnosis present

## 2017-02-07 LAB — CBC
HEMATOCRIT: 42.5 % (ref 39.0–52.0)
HEMOGLOBIN: 16.1 g/dL (ref 13.0–17.0)
MCH: 34.1 pg — ABNORMAL HIGH (ref 26.0–34.0)
MCHC: 36.6 g/dL — ABNORMAL HIGH (ref 30.0–36.0)
MCV: 90 fL (ref 78.0–100.0)
Platelets: 257 10*3/uL (ref 150–400)
RBC: 4.72 MIL/uL (ref 4.22–5.81)
RDW: 12.1 % (ref 11.5–15.5)
WBC: 9.2 10*3/uL (ref 4.0–10.5)

## 2017-02-07 LAB — BASIC METABOLIC PANEL
ANION GAP: 7 (ref 5–15)
BUN: 18 mg/dL (ref 6–20)
CHLORIDE: 105 mmol/L (ref 101–111)
CO2: 26 mmol/L (ref 22–32)
Calcium: 8.7 mg/dL — ABNORMAL LOW (ref 8.9–10.3)
Creatinine, Ser: 1.1 mg/dL (ref 0.61–1.24)
Glucose, Bld: 119 mg/dL — ABNORMAL HIGH (ref 65–99)
POTASSIUM: 3.9 mmol/L (ref 3.5–5.1)
SODIUM: 138 mmol/L (ref 135–145)

## 2017-02-07 LAB — D-DIMER, QUANTITATIVE (NOT AT ARMC)

## 2017-02-07 LAB — I-STAT TROPONIN, ED: Troponin i, poc: 0 ng/mL (ref 0.00–0.08)

## 2017-02-07 LAB — CBG MONITORING, ED: GLUCOSE-CAPILLARY: 117 mg/dL — AB (ref 65–99)

## 2017-02-07 MED ORDER — LORAZEPAM 1 MG PO TABS
1.0000 mg | ORAL_TABLET | Freq: Once | ORAL | Status: AC
  Administered 2017-02-07: 1 mg via ORAL
  Filled 2017-02-07: qty 1

## 2017-02-07 NOTE — ED Provider Notes (Signed)
[facility]-EMERGENCY DEPT Provider Note   CSN: [id number] Arrival date & time: 02/07/17  1657     History   Chief Complaint Chief Complaint  Patient presents with  . Chest Pain    HPI Bryan [name] is a 41 y.o. male presenting with 4 days chest pain.  Patient states that he first started experiencing this chest pain 4 days ago. It is intermittent and lasts for less than 60 minutes. Pain subsides without intervention. He cannot associate any trigger with the pain including movement or eating. [place], and was not given any rx for his chest pain. He was seen by his primary care yesterday, and was put on a new blood pressure medicine. He took lisinopril today, with possible relief with this chest pain. He has been taking naproxen twice daily, and reports it sometimes helps his chest pain. 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 He has been working harder than normal the past 2 months. Additionally, he reports feeling like he can't get a good breath in. He states he feels he is anxious. He denies diaphoresis, nausea, vomiting, abdominal pain, fever, chills, urinary symptoms, or abnormal bowel movements. He denies leg pain or swelling.  Per chart review, patient was evaluated here in the ED for similar chest pain. Patient's symptoms improved with Ativan.    HPI  Past Medical History:  Diagnosis Date  . Chronic back pain     Patient Active Problem List   Diagnosis Date Noted  . Essential (primary) hypertension 02/05/2017  . Family history of diabetes mellitus 10/08/2016  . Left knee pain 01/10/2016  . Plantar fasciitis of left foot 01/10/2016  . Right shoulder pain 01/10/2016  . Constipation 12/17/2011    Past Surgical History:  Procedure Laterality Date  . APPENDECTOMY         Home Medications    Prior to Admission medications   Medication Sig Start Date End Date Taking?  Authorizing Provider  lisinopril (PRINIVIL,ZESTRIL) 10 MG tablet Take 1 tablet (10 mg total) by mouth daily. 02/05/17  Yes Sagardia, Eilleen Kempf, MD  naproxen (NAPROSYN) 500 MG tablet Take 1 tablet (500 mg total) by mouth 2 (two) times daily with a meal. 10/08/16  Yes Stallings, Zoe A, MD  NON FORMULARY Take 2 capsules by mouth 2 (two) times daily.    Yes [provider]  omeprazole (PRILOSEC) 20 MG capsule Take 1 capsule (20 mg total) by mouth daily. 10/08/16  Yes Doristine Bosworth, MD    Family History Family History  Problem Relation Age of Onset  . Cancer Mother   . Diabetes Mother   . Diabetes Father     Social History Social History  Substance Use Topics  . Smoking status: Never Smoker  . Smokeless tobacco: Never Used  . Alcohol use No     Allergies   Patient has no known allergies.   Review of Systems Review of Systems  Constitutional: Negative for chills and fever.  HENT: Negative for congestion and sore throat.   Eyes: Negative for photophobia and visual disturbance.  Respiratory: Positive for shortness of breath. Negative for cough and chest tightness.   Cardiovascular: Positive for chest pain. Negative for palpitations and leg swelling.  Gastrointestinal: Negative for abdominal distention, abdominal pain, constipation, diarrhea, nausea and vomiting.  Genitourinary: Negative for dysuria, frequency and hematuria.  Musculoskeletal: Negative for neck pain and  neck stiffness.  Skin: Negative for rash.  Neurological: Negative for dizziness, light-headedness, numbness and headaches.  Psychiatric/Behavioral: Negative for confusion.     Physical Exam Updated Vital Signs BP (!) 150/101   Pulse 83   Resp 18   Ht 5\' 11"  (1.803 m)   Wt 104.3 kg (230 lb)   SpO2 98%   BMI 32.08 kg/m   Physical Exam  Constitutional: He is oriented to person, place, and time. He appears well-developed and well-nourished.  HENT:  Head: Normocephalic and atraumatic.  Eyes:  Pupils are equal, round, and reactive to light. Conjunctivae are normal.  Neck: Normal range of motion.  Cardiovascular: Regular rhythm and intact distal pulses.  Tachycardia present.   Pulmonary/Chest: Effort normal and breath sounds normal. No respiratory distress. He has no wheezes. He exhibits tenderness (TTP of the L chest wall from nipple to axila along the 4-6th ICS).  Abdominal: Soft. Bowel sounds are normal. He exhibits no distension. There is no tenderness.  Musculoskeletal: Normal range of motion.  Neurological: He is alert and oriented to person, place, and time.  Skin: Skin is warm and dry.  Psychiatric: His mood appears anxious.  Nursing note and vitals reviewed.    ED Treatments / Results  Labs (all labs ordered are listed, but only abnormal results are displayed) Labs Reviewed  BASIC METABOLIC PANEL - Abnormal; Notable for the following:       Result Value   Glucose, Bld 119 (*)    Calcium 8.7 (*)    All other components within normal limits  CBC - Abnormal; Notable for the following:    MCH 34.1 (*)    MCHC 36.6 (*)    All other components within normal limits  CBG MONITORING, ED - Abnormal; Notable for the following:    Glucose-Capillary 117 (*)    All other components within normal limits  D-DIMER, QUANTITATIVE (NOT [facility])  I-STAT TROPOININ, ED    EKG  EKG Interpretation  Date/Time:  Monday February 07 2017 17:09:01 EDT Ventricular Rate:  113 PR Interval:    QRS Duration: 96 QT Interval:  314 QTC Calculation: 431 R Axis:   66 Text Interpretation:  Sinus tachycardia Since last tracing rate faster Confirmed by Richardean CanalYao, David H [phone]([id number]) on 02/07/2017 7:30:21 PM       Radiology Dg Chest 2 View  Result Date: 02/07/2017 CLINICAL DATA:  Chest pain EXAM: CHEST  2 VIEW COMPARISON:  Chest radiograph 05/29/2016 FINDINGS: The heart size and mediastinal contours are within normal limits. Both lungs are clear. The visualized skeletal structures are unremarkable.  IMPRESSION: No active cardiopulmonary disease. Electronically Signed   By: Deatra RobinsonKevin  Herman M.D.   On: 02/07/2017 20:38    Procedures Procedures (including critical care time)  Medications Ordered in ED Medications  LORazepam (ATIVAN) tablet 1 mg (1 mg Oral Given 02/07/17 2210)     Initial Impression / Assessment and Plan / ED Course  I have reviewed the triage vital signs and the nursing notes.  Pertinent labs & imaging results that were available during my care of the patient were reviewed by me and considered in my medical decision making (see chart for details).     Patient presenting with 4 days intermittent chest pain. Has been seen previously in ED and primary care, but still reporting chest pain. Additionally, he reports he feels like he cannot take a deep breath in. Throughout the exam, patient discussing other family members have had similar chest pain diagnosed with other conditions  such as cancer and pneumonia. Patient is very anxious. Physical exam shows tenderness to palpation of the chest wall. No wheezing, rales, or signs of respiratory distress. Patient is slightly tachycardic. Initial troponin and EKG reassuring. Labs unremarkable. D-dimer negative. Chest x-ray negative. Patient's pain improved with the knowledge that he is not having a heart attack. He continues to report that he feels like he can't get a full breath in. Patient symptoms improved with Ativan. Patient will longer tachycardic by the end of the visit. Blood pressure elevated. Discussed importance of follow-up with primary care to discuss symptoms and anxiety. Case discussed with attending, and Dr. Silverio Lay agrees to plan. Patient appears safe for discharge. Return precautions given. Patient states he understands and agrees.   Final Clinical Impressions(s) / ED Diagnoses   Final diagnoses:  Precordial pain    New Prescriptions Discharge Medication List as of 02/07/2017 10:18 PM       Alveria Apley,  PA-C 02/07/17 2315    Charlynne Pander, MD 02/07/17 [phone]

## 2017-02-07 NOTE — Discharge Instructions (Addendum)
Es importante que da una cita con el doctor primaria para hablar sobre el anxiedad.  Cotinua a tomar las The St. Paul Travelerspastillas normales.  Regresa al emergencia si tiene fiebre, dolor que persiste, o sintomas nuevas o peores.   It is important that you make an appointment with her primary care doctor to talk about your symptoms. Continue to take your medicine that you are prescribed. Return to the emergency room if you have fever, persistent pain, or any new or worsening symptoms.

## 2017-02-09 ENCOUNTER — Ambulatory Visit (INDEPENDENT_AMBULATORY_CARE_PROVIDER_SITE_OTHER): Payer: 59 | Admitting: Physician Assistant

## 2017-02-09 ENCOUNTER — Encounter: Payer: Self-pay | Admitting: Physician Assistant

## 2017-02-09 VITALS — BP 127/87 | HR 97 | Temp 97.7°F | Resp 18 | Ht 69.53 in | Wt 230.6 lb

## 2017-02-09 DIAGNOSIS — F329 Major depressive disorder, single episode, unspecified: Secondary | ICD-10-CM

## 2017-02-09 DIAGNOSIS — R0789 Other chest pain: Secondary | ICD-10-CM | POA: Diagnosis not present

## 2017-02-09 DIAGNOSIS — F419 Anxiety disorder, unspecified: Secondary | ICD-10-CM | POA: Diagnosis not present

## 2017-02-09 DIAGNOSIS — M62838 Other muscle spasm: Secondary | ICD-10-CM | POA: Diagnosis not present

## 2017-02-09 DIAGNOSIS — F32A Depression, unspecified: Secondary | ICD-10-CM

## 2017-02-09 MED ORDER — CYCLOBENZAPRINE HCL 5 MG PO TABS: 5.0000 mg | ORAL_TABLET | Freq: Three times a day (TID) | ORAL | 0 refills | Status: DC | PRN

## 2017-02-09 MED ORDER — ESCITALOPRAM OXALATE 10 MG PO TABS: 10.0000 mg | ORAL_TABLET | Freq: Every day | ORAL | 0 refills | Status: DC

## 2017-02-09 MED ORDER — HYDROXYZINE HCL 25 MG PO TABS: ORAL_TABLET | ORAL | 0 refills | Status: DC

## 2017-02-09 NOTE — Patient Instructions (Addendum)
Para la ansiedad y la depresin, vamos a comenzar con lexapro para la administracin a Air cabin crew y la hidroxicina segn sea necesario para los momentos de ataques de pnico. Los ISRS, como lexapro, pueden causar inicialmente que sus sntomas de ansiedad y depresin empeoren; solicite atencin inmediata si comienza a Warehouse manager pensamientos suicidas mientras toma este medicamento. Otros efectos secundarios pueden Air traffic controller gastrointestinal, nuseas y vmitos. Estos sntomas generalmente se resuelven despus de 2 semanas de tomar el medicamento. Los efectos secundarios de la hidroxicina pueden incluir somnolencia. Por favor, sigue conmigo en 4 semanas para la reevaluacin.   Para la [facility] de psicoterapia y desarrollo de habilidades para la vida ([address] [place], Lenore Manner [place]) - [phone] Medicina conductual de Christena Flake [place]) - [phone] [facility] - [phone] [facility] - [phone] [facility] Irish - [phone] [facility] el [facility] - [phone] (no presente seguro)  Para espasmos musculares y dolor, contine con naproxeno. Tambin puede usar flexeril hasta tres veces al da. Esto tambin puede causar somnolencia, as que no tome si tiene que usar [employer]. Adems, asegrese de mantenerse hidratado y haciendo estiramientos de cuello a continuacin.    For anxiety and depression, we are going to start lexapro for term management and hydroxyzine as needed for moments of panic attacks. SSRIs, like lexapro, can initially cause your anxiety and depression symptoms to worsen, please seek care immediately if you start to have suicidal thoughts while taking this medication. Other side effects can include GI upset, nausea, and vomiting. These symptoms typically resolve after 2 weeks of taking the medication. Side effects of hydroxyzine can include drowsiness. Please follow up with me in 4 weeks for  reevaluation.    For therapy -- [facility] ([address] [place] Joycelyn Schmid [place]) - [phone] [facility]) - [phone] [facility] - [phone] [facility] - [phone] Buena Irish - [phone] [facility]  - [phone] (do not file insurance)  For muscle spasms and pain, continue naproxen. You may also use flexeril up to three times a day. This can also cause drowsiness so do not take if you have to use heavy machinery. Also, make sure you are staying hydrated and doing neck stretches below.  Trastorno de ansiedad generalizada (Generalized Anxiety Disorder) El trastorno de ansiedad generalizada es un trastorno mental. Interfiere en las funciones vitales, incluyendo las [place], el trabajo y la escuela.  Es diferente de la ansiedad normal que todas las personas experimentan en algn momento de su vida en respuesta a sucesos y Chief Operating Officer. En verdad, la ansiedad normal nos ayuda a prepararnos y Human resources officer acontecimientos y actividades de la vida. La ansiedad normal desaparece despus de que el evento o la actividad ha finalizado.  El trastorno de ansiedad generalizada no est necesariamente relacionada con eventos o actividades especficas. Tambin causa un exceso de ansiedad en proporcin a sucesos o actividades especficas. En este trastorno la ansiedad es difcil de Chief Operating Officer. Los sntomas pueden variar de leves a muy graves. Las personas que sufren de trastorno de ansiedad generalizada pueden tener intensas olas de ansiedad con sntomas fsicos (ataques de pnico).  SNTOMAS  La ansiedad y la preocupacin asociada a este trastorno son difciles de Chief Operating Officer. Esta ansiedad y la preocupacin estn relacionados con muchos eventos de la vida y sus actividades y tambin ocurre durante ms [employer] que no ocurre, durante 6 meses o  ms. Las [employer]  la sufren pueden tener tres o ms de los siguientes sntomas (uno o ms en los nios):   Glass blower/designer.  Dificultades de concentracin.   Irritabilidad.  Tensin muscular  Dificultad para dormirse o sueo poco satisfactorio. DIAGNSTICO  Se diagnostica a travs de una evaluacin realizada por el mdico. El mdico le har preguntas acerca de su estado de nimo, sntomas fsicos y sucesos de [place] vida. Le har preguntas sobre su historia clnica, el consumo de alcohol o drogas, incluyendo los medicamentos recetados. [employer] Corporation un examen fsico e indicar anlisis de [place]. Ciertas enfermedades y el uso de determinadas sustancias pueden causar sntomas similares a este trastorno. Su mdico lo puede derivar a Music therapist en salud mental para una evaluacin ms profunda.Gerlean Ren  Las terapias siguientes se utilizan en el tratamiento de este trastorno:   Medicamentos - Se recetan antidepresivos para el control diario a Air cabin crew. Pueden indicarse tambin medicamentos para combatir la [employer] graves, especialmente cuando ocurren ataques de pnico.   Terapia conversada (psicoterapia) Ciertos tipos de psicoterapia pueden ser tiles en el tratamiento del trastorno de ansiedad generalizada, proporcionando apoyo, educacin y Optometrist. Una forma de psicoterapia llamada terapia cognitivo-conductual puede ensearle formas saludables de pensar y Publishing rights manager a los eventos y actividades de la vida diaria.  Tcnicasde manejo del estrs- Estas tcnicas incluyen el yoga, la meditacin y el ejercicio y pueden ser muy tiles cuando se practican con regularidad. Un especialista en salud mental puede ayudar a determinar qu tratamiento es mejor para usted. Algunas personas obtienen mejora con una terapia. Sin embargo, Economist requieren una combinacin de terapias.  Esta informacin no tiene Theme park manager el consejo del mdico. Asegrese de  hacerle al mdico cualquier pregunta que tenga. Document Released: 11/06/2012 Document Revised: 08/02/2014 Elsevier Interactive Patient Education  2017 [employer].  Crisis de angustia (Panic Attacks) Las crisis de [place] son sensaciones repentinas y Music therapist de mucho miedo o Dentist. Es posible que experimente estas sensaciones sin ningn motivo, cuando est relajado, preocupado (ansioso) o cuando duerme. CUIDADOS EN EL HOGAR  Tome todos los medicamentos segn las indicaciones.  Consulte a su mdico antes de comenzar a tomar nuevos medicamentos.  Cumpla con los controles mdicos segn las indicaciones.  SOLICITE AYUDA SI:  No puede tomar los Monsanto Company se lo han indicado.  Los sntomas no mejoran.  Los sntomas empeoran.  SOLICITE AYUDA DE INMEDIATO SI:  Sus crisis parecen diferentes de las habituales.  Tiene pensamientos acerca de Runner, broadcasting/film/video o daar a Economist.  Toma un medicamento para las crisis de [place] y presenta efectos secundarios.  ASEGRESE DE QUE:  Comprende estas instrucciones.  Controlar su afeccin.  Recibir ayuda de inmediato si no mejora o si empeora.  Esta informacin no tiene Theme park manager el consejo del mdico. Asegrese de hacerle al mdico cualquier pregunta que tenga. Document Released: 10/27/2010 Document Revised: 05/02/2013 Document Reviewed: 02/23/2013 Elsevier Interactive Patient Education  2017 Elsevier Inc.   Depresin mayor (Major Depressive Disorder) La depresin mayor es un trastorno del estado de nimo. No es lo mismo que sentir tristeza cuando ocurre algo malo. Este trastorno puede [address] o [address]. Trabajar o hacer cosas con la familia y los amigos puede volverse difcil debido a este trastorno. La depresin mayor puede causar lo siguiente en las personas que la padecen:  Nutritional therapist.  [place].  Impotencia.  Irritabilidad. Adems de estos sentimientos, las personas que padecen este  trastorno tienen por lo  menos 4de estos sntomas:  Problemas para dormir.  Dormir demasiado.  Un cambio importante en el apetito.  Un cambio importante en el peso.  Falta de energa.  Sentimientos de culpa o inutilidad.  Dificultad para concentrarse, recordar o tomar decisiones.  Movimientos lentos.  Agitacin.  Pensamientos o sueos de suicidio, o de daarse a s mismas.  Intento de suicidio. CUIDADOS EN EL HOGAR  Tome los medicamentos de venta libre y los recetados solamente como se lo haya indicado el mdico.  Oceanographer a todas las visitas de control como se lo haya indicado el mdico. Esto incluye cualquier terapia que le recomiende el mdico.  Reduzca el nivel de estrs. Haga cosas que disfruta, como andar en bicicleta, caminar o leer un libro.  Haga ejercicio fsico con frecuencia.  Consuma una dieta saludable.  No beba alcohol.  No consuma drogas.  Busque el apoyo de sus familiares y Personnel officer.  SOLICITE AYUDA DE INMEDIATO SI:  Empieza a escuchar voces.  Ve cosas que no existen.  Siente que las personas lo persiguen (paranoico).  Tiene pensamientos serios acerca de lastimarse a usted mismo o daar a Economist.  Piensa en el suicidio.  Esta informacin no tiene Theme park manager el consejo del mdico. Asegrese de hacerle al mdico cualquier pregunta que tenga. Document Released: 06/23/2015 Document Revised: 06/23/2015 Document Reviewed: 08/07/2012 Elsevier Interactive Patient Education  2017 [employer].  Ejercicios para los hombros (Shoulder Exercises) Consulte al mdico qu ejercicios son seguros para usted. Haga los ejercicios exactamente como se lo haya indicado el mdico y gradelos como se lo hayan indicado. Es normal sentir tirantez, tensin, presin o molestias leves mientras hace estos ejercicios, pero debe detenerse de inmediato si siente dolor repentino o si el dolor empeora.No comience a hacer estos ejercicios hasta que se lo  indique el mdico. EJERCICIOS DE AMPLITUD DE MOVIMIENTOS Estos ejercicios calientan los msculos y las articulaciones, y mejoran el movimiento y la flexibilidad del hombro. Estos ejercicios tambin ayudan a Engineer, materials, el adormecimiento y el hormigueo. En estos ejercicios, el hombro lesionado se estira directamente. EjercicioA: Pndulo 1. Prese cerca de una pared o una superficie de la que pueda sostenerse para mantener el equilibrio. 2. Flexione la cintura y deje que el brazo izquierdo/derecho cuelgue extendido. Use el otro brazo para apoyarse. Mantenga la espalda derecha y no trabe las rodillas. 3. Relaje los msculos del brazo y Control and instrumentation engineer izquierdo/derecho, y Armed forces technical officer la cadera y el tronco de modo que el brazo izquierdo/derecho se balancee libremente. El brazo debe balancearse por el movimiento del cuerpo, no por la fuerza de los msculos del brazo o el hombro. 4. Contine moviendo el cuerpo de modo que el brazo se balancee en las siguientes direcciones, como se lo haya indicado el mdico:  De lado a lado.  Hacia delante y [place].  En crculos, en el sentido de las agujas del reloj y en sentido contrario. 1. Contine cada movimiento durante __________ segundos o durante el tiempo que le haya indicado el mdico. 2. Vuelva lentamente a la posicin inicial. Repita __________ veces. Realice este ejercicio __________ veces al da. EjercicioB:Flexin, de pie 1. Prese y sostenga un palo de escoba, un bastn o un objeto similar. Coloque las manos separadas a una distancia un poco mayor que el ancho de sus hombros. [place] izquierda/derecha debe estar con la palma [place] arriba, y la otra mano debe estar con la palma hacia abajo. 2. Mantenga el codo extendido y los msculos del hombro relajados.  Baje el palo con el brazo sano para levantar el brazo izquierdo/derecho frente a su cuerpo y [employer] la cabeza hasta sentir un estiramiento en el hombro.  Evite encoger el hombro mientras  levanta el brazo. Mantenga los omplatos juntos, llvelos hacia el centro de la espalda. 1. Mantenga esta posicin durante __________ segundos. 2. Vuelva lentamente a la posicin inicial. Repita __________ veces. Realice este ejercicio __________ veces al da. EjercicioC: Abduccin, de pie 1. Prese y sostenga un palo de escoba, un bastn o un objeto similar. Coloque las manos separadas a una distancia un poco mayor que el ancho de sus hombros. [place] izquierda/derecha debe estar con la palma [place] arriba, y la otra mano debe estar con la palma hacia abajo. 2. Mientras mantiene el codo extendido y los msculos del hombro relajados, empuje el palo por adelante de su cuerpo hacia el lado izquierdo/derecho. Levante el brazo izquierdo/derecho al costado del cuerpo y [employer] la cabeza hasta sentir un estiramiento en el hombro.  No levante el brazo por encima de la altura del hombro, a menos que el mdico se lo indique.  Evite encoger el hombro mientras levanta el brazo. Mantenga los omplatos juntos, llvelos hacia el centro de la espalda. 1. Mantenga esta posicin durante __________ segundos. 2. Vuelva lentamente a la posicin inicial. Repita __________ veces. Realice este ejercicio __________ veces al da. Ejercicio D:Rotacin interna 1. Coloque la mano izquierda/derecha detrs de la espalda, con la palma [place] arriba. 2. Con la otra mano, cuelgue una banda para ejercicios, una toalla o un objeto similar sobre el hombro. Agarre la banda con la mano izquierda/derecha, de modo de tener agarrados ambos extremos. 3. Suavemente, tire de la banda hacia arriba hasta sentir un estiramiento en la parte de adelante del hombro izquierdo/derecho.  Evite encoger el hombro mientras levanta el brazo. Mantenga los omplatos juntos, llvelos hacia el centro de la espalda. 1. Mantenga esta posicin durante __________ segundos. 2. Afloje el estiramiento, suelte la banda y [employer]. Repita __________  veces. Realice este ejercicio __________ veces al da. EJERCICIOS DE ESTIRAMIENTO Estos ejercicios calientan los msculos y las articulaciones, y mejoran el movimiento y la flexibilidad del hombro. Estos ejercicios tambin ayudan a Engineer, materials, el adormecimiento y el hormigueo. Estos ejercicios se hacen con el hombro sano, que ayuda a Furniture conservator/restorer los msculos del hombro lesionado. EjercicioE: Estiramiento en una esquina (abduccin y rotacin externa) 1. Prese en una entrada de una puerta con un pie adelante del otro, a una corta distancia uno del otro. Esto se denomina escalonamiento. Si no llega a apoyar los [employer] de la [place], prese frente a una esquina de la habitacin. 2. Elija una de estas posiciones, como se lo haya indicado el mdico:  Coloque las manos y los antebrazos sobre el marco de la [place], por encima de la cabeza.  Coloque las manos y los antebrazos sobre el marco de la puerta, a la altura de la cabeza.  Coloque las manos y los antebrazos sobre el marco de la puerta, a la altura de los codos. 1. Lentamente, lleve el peso al pie de adelante hasta sentir un estiramiento en el pecho y en la parte de adelante de los hombros. Mantenga la cabeza y el pecho erguidos, y los msculos abdominales tensionados. 2. Mantenga esta posicin durante __________ segundos. 3. Para aflojar el estiramiento, lleve el peso al pie de atrs. Repita __________ veces. Realice este estiramiento __________ veces al da. Ejercicio F:Extensin, de pie 1.  Prese y sostenga un palo de escoba, un bastn o un objeto similar detrs de la espalda.  Las manos deben estar separadas a una distancia un poco mayor que el ancho de sus hombros.  Las palmas deben estar en direccin contraria a la espalda. 1. Manteniendo los codos extendidos y los msculos de los hombros relajados, aleje el palo de su cuerpo Teacher, adult education sentir un estiramiento en el hombro.  No encoja los hombros al Technical brewer. Mantenga  los omplatos juntos, llvelos hacia el centro de la espalda. 1. Mantenga esta posicin durante __________ segundos. 2. Vuelva lentamente a la posicin inicial. Repita __________ veces. Realice este ejercicio __________ veces al da. EJERCICIOS DE FORTALECIMIENTO Estos ejercicios fortalecen el hombro y le otorgan resistencia. La resistencia es la capacidad de usar los msculos durante un tiempo prolongado, incluso despus de que se cansen. Ejercicio G:Rotacin externa 1. Sintese en una silla estable que no tenga apoyabrazos. 2. Ate una banda para ejercicios a la altura del codo del lado izquierdo/derecho. 3. Coloque un objeto blando, como una toalla doblada o una almohada pequea entre la parte superior del brazo izquierdo/derecho y el cuerpo, de forma que el codo est separado del costado del cuerpo a una distancia de unas pocas pulgadas (alrededor de 10cm). 4. Sostenga el extremo de la banda para que quede tensa y no se afloje. 5. Con el codo presionado sobre el objeto blando, aleje el antebrazo izquierdo/derecho del abdomen. No mueva el cuerpo; solo Company secretary. 6. Mantenga esta posicin durante __________ segundos. 7. Vuelva lentamente a la posicin inicial. Repita __________ veces. Realice este ejercicio __________ veces al da. EjercicioH:Abduccin del hombro 1. Sintese en una silla estable que no tenga apoyabrazos o pngase de pie. 2. Sostenga una pesa de __________ con la mano izquierda/derecha, o una banda para ejercicios con ambas manos. 3. Comience con los brazos extendidos Phoebe Sharps y con la palma izquierda/derecha hacia adentro, en direccin a su cuerpo. 4. Lentamente, levante la mano izquierda/derecha hacia el costado. No levante la mano por encima de la altura del hombro, a menos que el mdico le diga que no hay riesgos.  Mantenga los brazos extendidos.  No encoja los hombros al [employer]. Mantenga los omplatos juntos, llvelos hacia el centro  de la espalda. 1. Mantenga esta posicin durante __________ segundos. 2. Baje lentamente el brazo y vuelva a la posicin inicial. Repita __________ veces. Realice este ejercicio __________ veces al da. Ejercicio I:Extensin del hombro 1. Sintese en una silla estable que no tenga apoyabrazos o pngase de pie. 2. Ate una banda para ejercicios a un objeto estable que est frente a usted, a la altura del hombro. 3. Sostenga un extremo de la banda para ejercicios en cada mano. Las palmas deben estar enfrentadas. 4. Extienda los codos y [employer] manos a la altura de los hombros. 5. Camine hacia atrs, alejndose del extremo fijo de la banda para ejercicios, hasta que [place] se tense y no quede floja. 6. Junte los omplatos y medida que baja las manos hacia los costados de los muslos. Detngase cuando las manos estn en la misma posicin en ambos costados. No deje que las manos vayan hacia atrs del cuerpo. 7. Mantenga esta posicin durante __________ segundos. 8. Vuelva lentamente a la posicin inicial. Repita __________ veces. Realice este ejercicio __________ veces al da. EjercicioJ:De pie, remo con el hombro 1. Sintese en una silla estable que no tenga apoyabrazos o pngase de pie. 2. Ate una banda para ejercicios  a un objeto estable que est frente a usted, de modo que la banda est a la altura de la cintura. 3. Sostenga un extremo de la banda para ejercicios en cada mano. Las palmas deben estar con los pulgares [place] arriba. 4. Flexione ambos codos en forma de L (aproximadamente a 90grados) y [employer] parte superior de los brazos a los costados. 5. Cam[place] hacia atrs hasta que la banda quede tensa y no se afloje. 6. Lentamente, lleve los codos hacia atrs de su cuerpo. 7. Mantenga esta posicin durante __________ segundos. 8. Vuelva lentamente a la posicin inicial. Repita __________ veces. Realice este ejercicio __________ veces al da. EjercicioK:Prensas de  hombro 1. Sintese en una silla estable que tenga apoyabrazos. Sintese erguido, con los pies apoyados en el suelo. 2. Coloque las [employer] apoyabrazos, con los codos flexionados y los dedos apuntando hacia adelante. Las manos deben estar parejas a los lados de su cuerpo. 3. Presione con las [employer] apoyabrazos para levantarse de la silla. Extienda los codos y levntese todo lo que pueda, sin estar incmodo.  Lleve los omplatos hacia abajo y no levante los hombros hacia las [place].  Mantenga los pies en el suelo. A medida que adquiere ms fuerza, los pies deben sostener menos peso del cuerpo [place] se [place]. 1. Mantenga esta posicin durante __________ segundos. 2. Lentamente, vuelva a sentarse en la silla. Repita __________ veces. Realice este ejercicio __________ veces al da. EjercicioL: Flexiones de [employer] pared 1. Prese frente a una pared estable. Coloque los pies separados de la pared a una distancia aproximada de un brazo. 2. [place] adelante y coloque las palmas sobre la pared a la altura de los hombros. 3. Sin levantar los pies del suelo, flexione los codos e inclnese hacia la pared. 4. Mantenga esta posicin durante __________ segundos. 5. Extienda los codos para regresar a la posicin inicial. Repita __________ veces. Realice este ejercicio __________ veces al da. Esta informacin no tiene Theme park manager el consejo del mdico. Asegrese de hacerle al mdico cualquier pregunta que tenga. Document Released: 01/03/2006 Document Revised: 08/02/2014 Document Reviewed: 03/23/2015 Elsevier Interactive Patient Education  2018 [employer].    Ejercicios para el cuello (Neck Exercises) Los ejercicios para el cuello pueden ser importantes por muchos motivos:  Pueden ayudarlo a mejorar y Pharmacologist la flexibilidad del cuello. Esto puede ser sumamente importante a medida que envejece.  Pueden ayudarlo a fortalecer el cuello. Esto facilitar los  movimientos.  Pueden reducir o Psychologist, sport and exercise de cuello.  Pueden ayudar a [employer] parte superior de la espalda. Hable con su mdico acerca de los ejercicios para el cuello que son recomendables para usted. EJERCICIOS PARA MEJORAR LA FLEXIBILIDAD DEL CUELLO Estiramiento de cuello Repita este ejercicio de 3 a 5veces. 1. Haga este ejercicio de pie o sentado en una silla. 2. Apoye los pies en el piso, con una separacin del ancho de los hombros. 3. Gire lentamente la cabeza hacia la derecha. Grela completamente hacia la derecha de modo que pueda ver por encima de su hombro derecho. No incline ni ladee la cabeza. 4. Mantenga esta posicin durante 10 a 30segundos. 5. Gire lentamente la cabeza hacia la izquierda, hasta poder ver por encima de su hombro izquierdo. 6. Mantenga esta posicin durante 10 a 30segundos. Retraccin del cuello Repita este ejercicio de 8 a 10veces. Hgalo 3 o 4veces al da, o como se lo haya indicado el mdico. 3. Haga este ejercicio de  pie o sentado en una silla firme. 4. Mire hacia adelante. No doble el cuello. 5. Use los dedos para empujar la barbilla hacia atrs. No doble el cuello al [employer]. Siga mirando hacia adelante. Si hace el ejercicio correctamente, tendr Neomia Dear leve sensacin en la garganta y sentir que la nuca se estira. 6. Mantenga la elongacin durante 1o 2segundos. Reljese y luego repita el ejercicio. EJERCICIOS PARA MEJORAR LA FUERZA DEL CUELLO Presin con el cuello Repita este ejercicio 10veces. Hgalo apenas se despierte por la maana y justo antes de [place], o como se lo haya indicado el mdico. 3. Recustese sobre su espalda en una cama firme o en el suelo, y coloque una almohada debajo de la cabeza. 4. Use los msculos del cuello para presionar la cabeza contra la almohada y enderezar la columna vertebral. 5. Mantenga la posicin lo mejor que pueda. Mantenga la cabeza mirando hacia arriba y el mentn  [place]. 6. Cuente lentamente hasta mantiene esta posicin. 7. Relaje algunos segundos. Repita. Fortalecimiento isomtrico Engineer, drilling serie completa de ejercicios 2veces al da o como se lo haya indicado el mdico. 1. Sintese en una silla con buen apoyo y coloque una mano sobre la frente. 2. Empuje hacia adelante con la cabeza y el cuello mientras que con la mano ejerce cierta presin hacia el lado contrario. Mantenga esta posicin durante 10segundos. 3. Reljese. Repita el ejercicio 3veces. 4. A continuacin, vuelva a realizar la secuencia, esta vez poniendo la mano contra la nuca. Use la cabeza y el cuello para empujar en la direccin contraria a la presin que ejerce con la mano. 5. Para terminar, haga el mismo ejercicio en cada lado de la cabeza, empujando hacia los lados en la direccin contraria a la presin de Engineer, site. Levantamiento de la cabeza desde la posicin decbito prono Repita este ejercicio 5veces. Hgalo 2veces al da o como se lo haya indicado el mdico. 3. Acustese boca abajo y apoye los codos de modo que el pecho y la parte superior de la espalda se eleven. 4. Comience con la cabeza mirando hacia abajo, cerca del pecho. Coloque el mentn cerca del pecho o [place]. 5. Eleve lentamente la cabeza. Hgalo hasta quedar mirando hacia adelante. Contine elevando y estirando la cabeza hacia atrs lo ms que pueda. 6. Mantenga la cabeza elevada durante 5segundos. A continuacin, bjela lentamente hasta la posicin inicial. Levantamiento de la cabeza desde la posicin decbito supino Repita este ejercicio de 8 a 10veces. Hgalo 2veces al da o como se lo haya indicado el mdico. 3. Acustese sobre su espalda, doble las rodillas apuntando hacia el techo y [employer] pies apoyados en el piso. 4. Levante lentamente la cabeza del suelo, y eleve el mentn hacia el [place]. 5. Mantenga esta posicin durante 5segundos. 6. Reljese y luego repita el  ejercicio. Retraccin escapular Repita este ejercicio 5veces. Hgalo 2veces al da o como se lo haya indicado el mdico. 1. Prese con los brazos a los costados. Mire hacia adelante. 2. Lentamente, lleve los hombros hacia atrs y hacia abajo hasta sentir que la zona de los omplatos, en la parte alta de la espalda, se estira. 3. Mantenga esta posicin durante 10 a 30segundos. 4. Reljese y luego repita el ejercicio. SOLICITE ATENCIN MDICA SI:  El dolor o las molestias en el cuello se vuelven mucho ms intensos cuando hace un ejercicio.  El dolor o las molestias en el cuello no mejoran en el trmino de las 2horas posteriores a Radio producer  los ejercicios. Si tiene alguno de Limited Brandsestos problemas, deje de ARAMARK Corporationhacer los ejercicios de inmediato. No vuelva a hacer los ejercicios a menos que el mdico lo autorice. SOLICITE ATENCIN MDICA DE INMEDIATO SI:  Siente un dolor sbito e intenso en el cuello. Si esto ocurre, deje de ARAMARK Corporationhacer los ejercicios de inmediato. No vuelva a hacer los ejercicios a menos que el mdico lo autorice.  Esta informacin no tiene Theme park managercomo fin reemplazar el consejo del mdico. Asegrese de hacerle al mdico cualquier pregunta que tenga. Document Released: 08/08/2015 Document Revised: 08/08/2015 Document Reviewed: 01/20/2015 Elsevier Interactive Patient Education  2017 [employer]  IF you received an x-ray today, you will receive an invoice from [facility]. Please contact [facility] at [phone] with questions or concerns regarding your invoice.   IF you received labwork today, you will receive an invoice from [facility]. Please contact [facility] at [phone] with questions or concerns regarding your invoice.   Our billing staff will not be able to assist you with questions regarding bills from these companies.  You will be contacted with the lab results as soon as they are available. The fastest way to get your results is to activate your My Chart account.  Instructions are located on the last page of this paperwork. If you have not heard from us regarding the results in 2 weeks, please contact this office.

## 2017-02-09 NOTE — Progress Notes (Signed)
Berenda MoraleJuan Luis Dominguez [name]  MRN: [id number] DOB: [birth year]  Subjective:  Bryan FlowJuan Luis Bryan DaneDominguez [name] is a 41 y.o. male seen in office today for a chief complaint of depression and anxiety. Over the past week, he has been to the ED twice and our office once for chest pain. His last ED visit was on 02/07/17.  Per ED notes, EKG and troponin were reassuring, labs unremarkable, and negative D-dimer and CXR. His symptoms improved with ativan. it was suspected that this was likely due to anxiety and depression and he was instructed to follow up with his PCP to discuss his anxiety.   In terms of anxiety and depression, he notes this has been present for the past year. He works in [employer]ing. He frequently gets job placements that require him to be away from his family for long periods of time.Pt notes that [place] to his inital ED [place] of [place] and he was feeling really sad. [place] the next 3 months. He will plan to learve early on Monday mornings and return late on Friday to his family for the weekends during this time. Pt also endorses decreased pleasure in doing things he once liked to do, he has little energy doing everyday tasks that he needs to do, has decreased appetite,decreased sleep, and fatigue. He also endorses frequent anxiety and nervousness. He will occasionally have moments of heart palpitations and moments of difficult breathing. Denies suicidal or homicidal thoughts.   Stratus interpreter used.   Review of Systems  Constitutional: Negative for chills, diaphoresis and fever.  Eyes: Negative for visual disturbance.  Respiratory: Negative for cough and shortness of breath.   Cardiovascular: Positive for chest pain. Negative for palpitations.  Gastrointestinal: Negative for abdominal pain, nausea and vomiting.  Neurological: Negative for dizziness, weakness,  light-headedness and headaches.  Psychiatric/Behavioral: Negative for self-injury.    Patient Active Problem List   Diagnosis Date Noted  . Essential (primary) hypertension 02/05/2017  . Family history of diabetes mellitus 10/08/2016  . Left knee pain 01/10/2016  . Plantar fasciitis of left foot 01/10/2016  . Right shoulder pain 01/10/2016  . Constipation 12/17/2011    Current Outpatient Prescriptions on File Prior to Visit  Medication Sig Dispense Refill  . lisinopril (PRINIVIL,ZESTRIL) 10 MG tablet Take 1 tablet (10 mg total) by mouth daily. 90 tablet 3  . naproxen (NAPROSYN) 500 MG tablet Take 1 tablet (500 mg total) by mouth 2 (two) times daily with a meal. 60 tablet 6  . NON FORMULARY Take 2 capsules by mouth 2 (two) times daily.     Marland Kitchen. omeprazole (PRILOSEC) 20 MG capsule Take 1 capsule (20 mg total) by mouth daily. 30 capsule 3   No current facility-administered medications on file prior to visit.     No Known Allergies   Objective:  BP 127/87 (BP Location: Right Arm, Patient Position: Sitting, Cuff Size: Large)   Pulse 97   Temp 97.7 F (36.5 C) (Oral)   Resp 18   Ht 5' 9.53" (1.766 m)   Wt 230 lb 9.6 oz (104.6 kg)   SpO2 95%   BMI 33.54 kg/m   Physical Exam  Constitutional: He is oriented to person, place, and time.  HENT:  Head: Normocephalic and atraumatic.  Eyes: Conjunctivae are normal.  Neck: Normal range of motion and full passive range of motion without pain.  Muscular tenderness (bilateral musculature) present. No spinous process tenderness present. No neck rigidity. No erythema and normal range of motion present.  Cardiovascular: Normal rate, regular rhythm and normal heart sounds.   Pulmonary/Chest: Effort normal and breath sounds normal. He has no wheezes. He has no rales. He exhibits tenderness.    Musculoskeletal:       Right shoulder: Normal.       Left shoulder: Normal.       Cervical back: He exhibits spasm (in bilateral musculature). He  exhibits normal range of motion and no bony tenderness.       Thoracic back: Normal.       Lumbar back: Normal.  Neurological: He is alert and oriented to person, place, and time. Gait normal.  Skin: Skin is warm and dry.  Psychiatric: Affect normal.  Vitals reviewed.    Depression screen [facility] 2/9 02/09/2017 02/05/2017 10/08/2016 10/08/2016 05/29/2016  Decreased Interest 0 0 0 0 0  Down, Depressed, Hopeless 3 0 0 0 0  PHQ - 2 Score 3 0 0 0 0  Altered sleeping 3 - - - -  Tired, decreased energy 3 - - - -  Change in appetite 3 - - - -  Feeling bad or failure about yourself  3 - - - -  Trouble concentrating 3 - - - -  Moving slowly or fidgety/restless 1 - - - -  Suicidal thoughts 0 - - - -  PHQ-9 Score 19 - - - -  Difficult doing work/chores Somewhat difficult - - - -   GAD 7 : Generalized Anxiety Score 02/09/2017  Nervous, Anxious, on Edge 3  Control/stop worrying 3  Worry too much - different things 3  Trouble relaxing 2  Restless 3  Easily annoyed or irritable 3  Afraid - awful might happen 3  Total GAD 7 Score 20  Anxiety Difficulty Somewhat difficult   I personally reviewed the ED notes from 02/07/17 visit.   Assessment and Plan :  1. Anxiety and depression Anxiety and depression uncontrolled. Plan to initiate SSRI for long term management and hydroxyzine for moments of panic attacks. Pt educated on common side effects of medications. Instructed that it may take 4-6 weeks for SSRI to become fully effective. If pt develops any worsening anxiety/depression or new suicidal thoughts, please contact our office or seek care immediately.  Given local therapy information to patient in AVS. Encouraged to set up an appintment with therapist before our next follow up appointment. Plan to follow up with me in office in 4 weeks. - escitalopram (LEXAPRO) 10 MG tablet; Take 1 tablet (10 mg total) by mouth daily.  Dispense: 90 tablet; Refill: 0 - hydrOXYzine (ATARAX/VISTARIL) 25 MG tablet; Take  0.5-1 tablet daily as needed for anxiety.  Dispense: 30 tablet; Refill: 0  2. Muscle spasm 3. Chest wall tenderness Given educational material on neck and shoulder stretches.  Continue naproxen provided by ED. Use muscle relaxant as needed. Return to clinic if symptoms worsen, do not improve, or as needed - cyclobenzaprine (FLEXERIL) 5 MG tablet; Take 1 tablet (5 mg total) by mouth 3 (three) times daily as needed for muscle spasms.  Dispense: 60 tablet; Refill: 0  A total of 40 minutes was spent in the room with the patient, greater than 50% of which was in counseling/coordination of care regarding anxiety, depression, and muscle spasms.  Benjiman Core PA-C  [facility] 02/09/2017 5:10 PM

## 2017-03-09 ENCOUNTER — Ambulatory Visit: Payer: 59 | Admitting: Physician Assistant

## 2017-03-31 ENCOUNTER — Encounter: Payer: Self-pay | Admitting: Family Medicine

## 2017-03-31 ENCOUNTER — Ambulatory Visit (INDEPENDENT_AMBULATORY_CARE_PROVIDER_SITE_OTHER): Payer: 59

## 2017-03-31 ENCOUNTER — Ambulatory Visit (INDEPENDENT_AMBULATORY_CARE_PROVIDER_SITE_OTHER): Payer: 59 | Admitting: Family Medicine

## 2017-03-31 ENCOUNTER — Other Ambulatory Visit: Payer: Self-pay | Admitting: Family Medicine

## 2017-03-31 VITALS — BP 124/84 | HR 82 | Wt 238.0 lb

## 2017-03-31 DIAGNOSIS — M25561 Pain in right knee: Secondary | ICD-10-CM

## 2017-03-31 DIAGNOSIS — M170 Bilateral primary osteoarthritis of knee: Secondary | ICD-10-CM

## 2017-03-31 DIAGNOSIS — M1711 Unilateral primary osteoarthritis, right knee: Secondary | ICD-10-CM

## 2017-03-31 DIAGNOSIS — S8991XA Unspecified injury of right lower leg, initial encounter: Secondary | ICD-10-CM | POA: Diagnosis not present

## 2017-03-31 MED ORDER — DICLOFENAC SODIUM 1 % TD GEL: 4.0000 g | Freq: Four times a day (QID) | TRANSDERMAL | 11 refills | Status: DC

## 2017-03-31 NOTE — Progress Notes (Signed)
Bryan [name] is a 41 y.o. male who presents to [facility] today for knee pain.  Patient reports bilateral knee pain for 2 weeks. He reports that right knee is significantly worse that the left. Patient reports having difficulty walking last week due to pain. 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 Patient denies any new rashes or sick contacts. Patient endorses spending significant time working while kneeling. Patient reports some relief of pain with using icy-hot cream. Patient reports a history of knee injury when playing soccer many years ago. He has had knee pain since this time.   Patient denies any fevers, chills, weight loss, constipation, diarrhea, or changes in urination.    Past Medical History:  Diagnosis Date  . Chronic back pain    Past Surgical History:  Procedure Laterality Date  . APPENDECTOMY     Social History  Substance Use Topics  . Smoking status: Never Smoker  . Smokeless tobacco: Never Used  . Alcohol use No     ROS:  No headache, visual changes, nausea, vomiting, diarrhea, constipation, dizziness, abdominal pain, skin rash, fevers, chills, night sweats, weight loss, swollen lymph nodes, body aches, joint swelling, muscle aches, chest pain, shortness of breath, mood changes, visual or auditory hallucinations.     Medications: Current Outpatient Prescriptions  Medication Sig Dispense Refill  . naproxen (NAPROSYN) 500 MG tablet Take 1 tablet (500 mg total) by mouth 2 (two) times daily with a meal. 60 tablet 6  . diclofenac sodium (VOLTAREN) 1 % GEL Apply 4 g topically 4 (four) times daily. To affected joint. 100 g 11   No current facility-administered medications for this visit.    No Known Allergies   Exam:  BP 124/84   Pulse 82   Wt 238 lb (108 kg)   BMI 34.61 kg/m  General: Well Developed, well nourished, and in no acute  distress.  Neuro/Psych: Alert and oriented x3, extra-ocular muscles intact, able to move all 4 extremities, sensation grossly intact. Skin: Warm and dry, no rashes noted.  Respiratory: Not using accessory muscles, speaking in full sentences, trachea midline.  Cardiovascular: Pulses palpable, no extremity edema. Abdomen: Does not appear distended. MSK: Right knee: Hyperpigmentation present on patellar surface, no gross deformity or edema Tenderness to palpation along medial joint line Range of motion is full Strength is 5/5 with flexion and extension Lachman's negative, Posterior drawer sign negative, no pain with application of valgus or varus force   No results found for this or any previous visit (from the past 48 hour(s)). Dg Knee 1-2 Views Left  Result Date: 03/31/2017 CLINICAL DATA:  Two weeks of medial and posterior right knee pain. EXAM: RIGHT KNEE - COMPLETE 4+ VIEW; LEFT KNEE - 1-2 VIEW COMPARISON:  None in PACs FINDINGS: The bones of both knees are subjectively adequately mineralized. There is mild narrowing of the medial joint compartments bilaterally greatest on the left. There is beaking of the tibial spines bilaterally. Spurs arise from the articular margins of the patella. There is no acute fracture, dislocation, or joint effusion. IMPRESSION: Mild medial joint compartment space loss bilaterally with mild beaking of the tibial spines. 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 No acute bony abnormality. Electronically Signed   By: David  [place] M.D.   On: 03/31/2017 13:23   Dg Knee Complete 4 Views Right  Result Date: 03/31/2017 CLINICAL DATA:  Two weeks  of medial and posterior right knee pain. EXAM: RIGHT KNEE - COMPLETE 4+ VIEW; LEFT KNEE - 1-2 VIEW COMPARISON:  None in PACs FINDINGS: The bones of both knees are subjectively adequately mineralized. There is mild narrowing of the medial joint compartments bilaterally greatest on the left. There is beaking of  the tibial spines bilaterally. Spurs arise from the articular margins of the patella. There is no acute fracture, dislocation, or joint effusion. IMPRESSION: Mild medial joint compartment space loss bilaterally with mild beaking of the tibial spines. 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 No acute bony abnormality. Electronically Signed   By: David  SwazilandJordan M.D.   On: 03/31/2017 13:23     Assessment and Plan: 41 y.o. male with knee pain. Given history and physical exam findings, it is possible there is degenerative joint disease and or meniscal tear. Patient underwent knee x-ray today in clinic. Initial review showed some degenerative changes, but is mostly unremarkable.  At this point patient will need further evaluation with MRI to evaluate for meniscal tear. Patient was given a prescription for diclofenac gel and instructed to use 4 times daily. Patient will follow-up in clinic after MRI to review findings and discuss future treatment options.    Orders Placed This Encounter  Procedures  . DG Knee Complete 4 Views Right    Please include patellar sunrise, lateral, and weightbearing bilateral AP and bilateral rosenberg views    Standing Status:   Future    Number of Occurrences:   1    Standing Expiration Date:   05/31/2018    Order Specific Question:   Reason for exam:    Answer:   Please include patellar sunrise, lateral, and weightbearing bilateral AP and bilateral rosenberg views    Comments:   Please include patellar sunrise, lateral, and weightbearing bilateral AP and bilateral rosenberg views    Order Specific Question:   Preferred imaging location?    [facility]  . MR Knee Right Wo Contrast    Standing Status:   Future    Standing Expiration Date:   05/31/2018    Order Specific Question:   What is the patient's sedation requirement?    Answer:   No Sedation    Order Specific Question:   Does the patient have a pacemaker or implanted  devices?    Answer:   No    Order Specific Question:   Preferred imaging location?    [facility]    Order Specific Question:   Radiology Contrast Protocol - do NOT remove file path    Answer:   \\[email or web address]   Meds ordered this encounter  Medications  . diclofenac sodium (VOLTAREN) 1 % GEL    Sig: Apply 4 g topically 4 (four) times daily. To affected joint.    Dispense:  100 g    Refill:  11    Discussed warning signs or symptoms. Please see discharge instructions. Patient expresses understanding.

## 2017-03-31 NOTE — Patient Instructions (Signed)
Thank you for coming in today. Use the gel 4x daily.  You should hear about the MRI soon.  Recheck with me after the MRI.     Ruptura de meniscos (Meniscus Tear) Una ruptura de meniscos es una lesin en la rodilla en la que se rompe un fragmento del menisco. El menisco es un cartlago grueso y gomoso en forma de cua que se encuentra en la rodilla. En cada rodilla hay dos meniscos. Se alojan entre el hueso superior (fmur) y el inferior (tibia) que forman la articulacin de la rodilla. Cada menisco acta como un amortiguador de la rodilla. La ruptura de meniscos es uno de los tipos de lesiones ms frecuentes de la rodilla. Puede ser leve o grave. En los [place] graves, tal vez haya que realizar [place]. CAUSAS Cualquier movimiento puede causar esta lesin, por ejemplo, ponerse en cuclillas, rotar o girar [employer]. Por lo general, las rupturas de meniscos suelen se causadas por lesiones deportivas. Estas suelen producirse al:  Environmental consultant y detenerse de Rosalie repentina.  Cambiar de direccin.  Recibir un tacle o ser derribado. A medida que las personas envejecen, los meniscos se vuelven ms finos y ms dbiles. En estas personas, las rupturas pueden ocurrir con ms facilidad, por ejemplo, al subir escaleras. FACTORES DE RIESGO Es ms probable que esta lesin se produzca en:  Las personas que practican deportes de Pharmacologist.  Los hombres.  Las personas que tienen entre 30 y [place]. SNTOMAS Los sntomas de esta lesin incluyen lo siguiente:  Dolor en la rodilla, especialmente al costado de la articulacin. Puede sentir dolor al producirse la lesin o solo escuchar un estallido y sentir el dolor ms tarde.  Sensacin de que la rodilla cruje, se engancha, se traba o se afloja.  Imposibilidad de flexionar o extender la rodilla por completo.  Moretones o hinchazn en la rodilla. DIAGNSTICO Esta lesin se puede diagnosticar en funcin de los sntomas y de un examen fsico. El  examen fsico puede incluir lo siguiente:  Mover la rodilla de Pensions consultant.  Realizar una palpacin para determinar si hay dolor.  Tratar de percibir un chasquido.  Comprobar si la rodilla se traba o se engancha. Tambin pueden hacerle estudios, por ejemplo:  Radiografas.  Resonancia magntica.  Un procedimiento para examinar el interior de la rodilla con un pequeo endoscopio quirrgico (artroscopia). Pueden derivarlo a un especialista en rodilla (cirujano ortopdico). TRATAMIENTO El tratamiento depende de la gravedad de la lesin. El tratamiento para una rotura leve puede incluir lo siguiente:  Reposo.  Medicamentos para Engineer, materials y reducir la hinchazn. Generalmente, se trata de antiinflamatorios no esteroides (AINE).  [place], o bien una funda o una venda elstica.  Usar [employer] o un andador para quitar peso de la rodilla y ayudarlo a Advertising account planner.  Ejercicios para fortalecer la rodilla (fisioterapia). Tal vez deba someterse a una ciruga si la ruptura es grave o si otros tratamientos no resultan eficaces. INSTRUCCIONES PARA EL CUIDADO EN EL HOGAR Control del dolor y Energy manager los medicamentos de venta libre y los recetados solamente como se lo haya indicado el mdico.  Si se lo indican, aplique hielo sobre la zona lesionada: ? Nature conservation officer hielo en una bolsa plstica. ? Coloque una [employer] piel y la bolsa de hielo. ? Coloque el hielo durante , 2 a 3veces por da.  Cuando est sentado o acostado, eleve la zona de la lesin por encima del nivel del corazn. Actividad  No  apoye el peso del cuerpo sobre la extremidad Dillard'slesionada hasta que lo autorice el mdico. Use muletas o un andador como se lo haya indicado el mdico.  Reanude sus actividades normales como se lo haya indicado el mdico. Pregntele al mdico qu actividades son seguras para usted.  Haga ejercicios de flexibilidad solamente como se lo haya indicado el  mdico.  Comience a Radio producerhacer los ejercicios para fortalecer la rodilla y los msculos de la pierna solamente como se lo haya indicado el mdico. Despus de que se recupere, el mdico puede recomendarle estos ejercicios para ayudar a Automotive engineerevitar que se produzca otra lesin. Instrucciones generales  Use una rodillera o una venda elstica como se lo haya indicado el mdico.  Concurra a todas las visitas de control como se lo haya indicado el mdico. Esto es importante. SOLICITE ATENCIN MDICA SI:  Lance Mussiene fiebre.  La rodilla est enrojecida, hinchada o le duele.  El medicamento no IT trainerle calma el dolor.  Los sntomas empeoran o no mejoran despus de 2semanas de Medical laboratory scientific officercuidados en el hogar. Esta informacin no tiene Theme park managercomo fin reemplazar el consejo del mdico. Asegrese de hacerle al mdico cualquier pregunta que tenga. Document Released: 04/20/2008 Document Revised: 04/02/2015 Document Reviewed: 11/04/2014 Elsevier Interactive Patient Education  [employer]2018 Elsevier Inc.

## 2017-04-04 ENCOUNTER — Ambulatory Visit (INDEPENDENT_AMBULATORY_CARE_PROVIDER_SITE_OTHER): Payer: 59

## 2017-04-04 DIAGNOSIS — M25561 Pain in right knee: Secondary | ICD-10-CM | POA: Diagnosis not present

## 2017-04-04 DIAGNOSIS — M7051 Other bursitis of knee, right knee: Secondary | ICD-10-CM

## 2017-04-12 ENCOUNTER — Ambulatory Visit: Payer: Commercial Managed Care - HMO | Admitting: Family Medicine

## 2017-04-15 ENCOUNTER — Encounter: Payer: Self-pay | Admitting: Family Medicine

## 2017-04-15 ENCOUNTER — Ambulatory Visit (INDEPENDENT_AMBULATORY_CARE_PROVIDER_SITE_OTHER): Payer: 59 | Admitting: Family Medicine

## 2017-04-15 VITALS — BP 131/76 | HR 75 | Wt 238.0 lb

## 2017-04-15 DIAGNOSIS — M23303 Other meniscus derangements, unspecified medial meniscus, right knee: Secondary | ICD-10-CM | POA: Diagnosis not present

## 2017-04-15 NOTE — Patient Instructions (Signed)
Thank you for coming in today. If not better in 2-4 weeks let me know and we will arrange for orthopedic surgery.  Let me know how you are doing.    Ruptura de meniscos (Meniscus Tear) Una ruptura de meniscos es una lesin en la rodilla en la que se rompe un fragmento del menisco. El menisco es un cartlago grueso y gomoso en forma de cua que se encuentra en la rodilla. En cada rodilla hay dos meniscos. Se alojan entre el hueso superior (fmur) y el inferior (tibia) que forman la articulacin de la rodilla. Cada menisco acta como un amortiguador de la rodilla. La ruptura de meniscos es uno de los tipos de lesiones ms frecuentes de la rodilla. Puede ser leve o grave. En los [place] graves, tal vez haya que realizar [place]. CAUSAS Cualquier movimiento puede causar esta lesin, por ejemplo, ponerse en cuclillas, rotar o girar [employer]. Por lo general, las rupturas de meniscos suelen se causadas por lesiones deportivas. Estas suelen producirse al:  Environmental consultant y detenerse de [place] repentina.  Cambiar de direccin.  Recibir un tacle o ser derribado. A medida que las personas envejecen, los meniscos se vuelven ms finos y ms dbiles. En estas personas, las rupturas pueden ocurrir con ms facilidad, por ejemplo, al subir escaleras. FACTORES DE RIESGO Es ms probable que esta lesin se produzca en:  Las personas que practican deportes de Pharmacologist.  Los hombres.  Las personas que tienen entre 30 y [place]. SNTOMAS Los sntomas de esta lesin incluyen lo siguiente:  Dolor en la rodilla, especialmente al costado de la articulacin. Puede sentir dolor al producirse la lesin o solo escuchar un estallido y sentir el dolor ms tarde.  Sensacin de que la rodilla cruje, se engancha, se traba o se afloja.  Imposibilidad de flexionar o extender la rodilla por completo.  Moretones o hinchazn en la rodilla. DIAGNSTICO Esta lesin se puede diagnosticar en funcin de los sntomas y de  un examen fsico. El examen fsico puede incluir lo siguiente:  Mover la rodilla de Pensions consultant.  Realizar una palpacin para determinar si hay dolor.  Tratar de percibir un chasquido.  Comprobar si la rodilla se traba o se engancha. Tambin pueden hacerle estudios, por ejemplo:  Radiografas.  Resonancia magntica.  Un procedimiento para examinar el interior de la rodilla con un pequeo endoscopio quirrgico (artroscopia). Pueden derivarlo a un especialista en rodilla (cirujano ortopdico). TRATAMIENTO El tratamiento depende de la gravedad de la lesin. El tratamiento para una rotura leve puede incluir lo siguiente:  Reposo.  Medicamentos para Engineer, materials y reducir la hinchazn. Generalmente, se trata de antiinflamatorios no esteroides (AINE).  [place], o bien una funda o una venda elstica.  Usar [employer] o un andador para quitar peso de la rodilla y ayudarlo a Advertising account planner.  Ejercicios para fortalecer la rodilla (fisioterapia). Tal vez deba someterse a una ciruga si la ruptura es grave o si otros tratamientos no resultan eficaces. INSTRUCCIONES PARA EL CUIDADO EN EL HOGAR Control del dolor y Energy manager los medicamentos de venta libre y los recetados solamente como se lo haya indicado el mdico.  Si se lo indican, aplique hielo sobre la zona lesionada: ? Nature conservation officer hielo en una bolsa plstica. ? Coloque una [employer] piel y la bolsa de hielo. ? Coloque el hielo durante , 2 a 3veces por da.  Cuando est sentado o acostado, eleve la zona de la lesin por encima del nivel del corazn.  Actividad  No apoye el peso del cuerpo [employer] extremidad [employer] que lo autorice el mdico. Use muletas o un andador como se lo haya indicado el mdico.  Reanude sus actividades normales como se lo haya indicado el mdico. Pregntele al mdico qu actividades son seguras para usted.  Haga ejercicios de flexibilidad solamente como se lo haya  indicado el mdico.  Comience a Radio producer los ejercicios para fortalecer la rodilla y los msculos de la pierna solamente como se lo haya indicado el mdico. Despus de que se recupere, el mdico puede recomendarle estos ejercicios para ayudar a Automotive engineer que se produzca otra lesin. Instrucciones generales  Use una rodillera o una venda elstica como se lo haya indicado el mdico.  Concurra a todas las visitas de control como se lo haya indicado el mdico. Esto es importante. SOLICITE ATENCIN MDICA SI:  Lance Muss.  La rodilla est enrojecida, hinchada o le duele.  El medicamento no IT trainer.  Los sntomas empeoran o no mejoran despus de 2semanas de Medical laboratory scientific officer. Esta informacin no tiene Theme park manager el consejo del mdico. Asegrese de hacerle al mdico cualquier pregunta que tenga. Document Released: 04/20/2008 Document Revised: 04/02/2015 Document Reviewed: 11/04/2014 Elsevier Interactive Patient Education  2018 [employer].  Drumright para la fractura de meniscos (Surgery for Meniscus Tear) Un menisco es un trozo de TEFL teacher en forma de cua que se encuentra en el interior de la rodilla. La ciruga para una fractura de meniscos es un procedimiento que se realiza para tratar un menisco roto. Se realiza para extraer el fragmento roto del menisco o para reparar el menisco con puntos (suturas). INFORME A SU MDICO:  Cualquier alergia que tenga.  Todos los [employer], incluidos vitaminas, hierbas, gotas oftlmicas, cremas y [address] de [address].  Problemas previos que usted o los [employer] de su familia hayan tenido con el uso de anestsicos.  Enfermedades de la sangre que tenga.  Si tiene cirugas previas.  Cualquier enfermedad que tenga.  Si est embarazada o podra estarlo. RIESGOS Y COMPLICACIONES En general, se trata de un procedimiento seguro. Sin embargo, pueden presentarse problemas, por  ejemplo:  Hemorragia.  Infeccin.  Reacciones alrgicas a los medicamentos, incluida la anestesia.  [employer] nervios, los vasos sanguneos u otras estructuras de la rodilla.  Dolor o rigidez permanentes despus de la ciruga.  Un cogulo sanguneo que se forma en una pierna y se desplaza hasta los pulmones (embolia pulmonar). ANTES DEL PROCEDIMIENTO Mantngase hidratado Siga las indicaciones del mdico acerca de la hidratacin, las cuales pueden incluir lo siguiente:  Hasta 2horas antes del procedimiento, puede seguir bebiendo lquidos claros, como agua, jugos de frutas claros, caf negro y t solo. Restricciones en cuanto a las comidas y las [address] las indicaciones del mdico respecto de las comidas y las bebidas, las cuales pueden incluir lo siguiente:  [place]a [place] horas antes del procedimiento, deje de ingerir comidas o alimentos pesados, como carne, alimentos fritos o grasos.  Seis horas antes del procedimiento, deje de ingerir comidas o alimentos livianos, como tostadas o cereales.  Seis horas antes del procedimiento, deje de tomar [address] o bebidas que contienen [place].  Dos horas antes del procedimiento, deje de beber lquidos claros. Otras indicaciones  Consulte al mdico si debe hacer o no lo siguiente: ? Cambiar o suspender los medicamentos que toma habitualmente. Esto es muy importante si toma medicamentos para la diabetes o anticoagulantes. ? Tomar medicamentos como aspirina e ibuprofeno. Estos  medicamentos pueden tener un efecto anticoagulante en la [place]. No tome estos medicamentos antes del procedimiento si el mdico le indica que no lo haga.  Haga planes para que una persona lo lleve a su casa desde el hospital o la clnica.  Si se ir a su casa inmediatamente despus del procedimiento, planifique que alguien se quede con usted durante 24horas.  Pregntele al mdico cmo se Cabin crew o se Audiological scientist de la Leisure centre manager.  Pueden darle antibiticos para  ayudar a prevenir las infecciones. PROCEDIMIENTO  Para reducir el riesgo de infecciones: ? El equipo mdico se lavar o se desinfectar las manos. ? Le lavarn la piel con jabn.  Le colocarn una va intravenosa (IV) en una de las venas.  Le administrarn uno o ms de los siguientes medicamentos: ? Un medicamento para ayudarlo a relajarse (sedante). ? Un medicamento que lo har dormir (anestesia general). ? Un medicamento que se inyecta en una zona del cuerpo para adormecer toda la regin que se encuentra ms all del lugar de la inyeccin (anestesia regional).  Despus de que la anestesia empiece a hacer efecto, el cirujano har pequeos cortes (incisiones) alrededor de la rodilla.  Se inyectar un lquido asptico transparente en la rodilla a travs de una incisin, para ayudar al cirujano a ver el interior de Nurse, learning disability.  Se introducir un telescopio delgado con una cmara (artroscopio) a travs de otra incisin. La cmara enviar imgenes a una pantalla que se encuentra en el quirfano. El [place] usar estas imgenes como gua durante la [place].  Se introducirn otros instrumentos quirrgicos a travs de las otras incisiones.  Se extraern los fragmentos rotos del menisco, o bien se repararn los bordes de la fractura con suturas.  Se retirarn los instrumentos y el artroscopio.  Se lavar la articulacin de la rodilla con el lquido.  Las incisiones se cerrarn con suturas pequeas o tiras [id number].  Se colocar una venda (vendaje) sobre la rodilla y otro vendaje alrededor de Acupuncturist. Este procedimiento puede variar segn el mdico y el hospital. DESPUS DEL PROCEDIMIENTO  Conley Rolls controlarn con frecuencia la presin arterial, la frecuencia cardaca, la frecuencia respiratoria y Air cabin crew de oxgeno en la sangre hasta que haya desaparecido el efecto de los medicamentos administrados.  Le darn medicamentos para Human resources officer.  Pueden colocarle una rodillera e  indicarle que use muletas o un andador.  No conduzca durante 24horas si le administraron un sedante. Esta informacin no tiene Theme park manager el consejo del mdico. Asegrese de hacerle al mdico cualquier pregunta que tenga. Document Released: 06/27/2015 Document Revised: 06/27/2015 Document Reviewed: 06/27/2015 Elsevier Interactive Patient Education  [employer].

## 2017-04-15 NOTE — Progress Notes (Signed)
Bryan [name] Nameer Summer is a 41 y.o. male who presents to [facility] today for right knee pain follow-up. Patient was seen a few weeks ago for right knee pain. An MRI which showed medial meniscus tear hamstring tendon tear and chondromalacia patella. He notes medial joint line pain overall notes pain interfering with work. He would like to avoid surgery if possible as an and is interested in injection.   Past Medical History:  Diagnosis Date  . Chronic back pain    Past Surgical History:  Procedure Laterality Date  . APPENDECTOMY     Social History  Substance Use Topics  . Smoking status: Never Smoker  . Smokeless tobacco: Never Used  . Alcohol use No     ROS:  As above   Medications: Current Outpatient Prescriptions  Medication Sig Dispense Refill  . diclofenac sodium (VOLTAREN) 1 % GEL Apply 4 g topically 4 (four) times daily. To affected joint. 100 g 11  . naproxen (NAPROSYN) 500 MG tablet Take 1 tablet (500 mg total) by mouth 2 (two) times daily with a meal. 60 tablet 6   No current facility-administered medications for this visit.    No Known Allergies   Exam:  BP 131/76   Pulse 75   Wt 238 lb (108 kg)   BMI 34.61 kg/m  General: Well Developed, well nourished, and in no acute distress.  Neuro/Psych: Alert and oriented x3, extra-ocular muscles intact, able to move all 4 extremities, sensation grossly intact. Skin: Warm and dry, no rashes noted.  Respiratory: Not using accessory muscles, speaking in full sentences, trachea midline.  Cardiovascular: Pulses palpable, no extremity edema. Abdomen: Does not appear distended. MSK: Right knee no effusion Range of motion 0-120 Tender to palpation medial joint line. Nontender overlying the semimembranosus insertion Intact flexion and extension strength. Stable ligamentous exam  Procedure: Real-time Ultrasound Guided Injection of right knee  Device: GE Logiq E   Images permanently stored and available for review in the ultrasound unit. Verbal informed consent obtained. Discussed risks and benefits of procedure. Warned about infection bleeding damage to structures skin hypopigmentation and fat atrophy among others. Patient expresses understanding and agreement Time-out conducted.  Noted no overlying erythema, induration, or other signs of local infection.  Skin prepped in a sterile fashion.  Local anesthesia: Topical Ethyl chloride.  With sterile technique and under real time ultrasound guidance:  kenalog and 4ml marcaine injected easily.  Completed without difficulty  Pain immediately resolved suggesting accurate placement of the medication.  Advised to call if fevers/chills, erythema, induration, drainage, or persistent bleeding.  Images permanently stored and available for review in the ultrasound unit.  Impression: Technically successful ultrasound guided injection.    Study Result   CLINICAL DATA:  Worsening media and posterior knee pain accentuated after sitting and driving. No prior applicable surgical history. Old soccer injury.  EXAM: MRI OF THE RIGHT KNEE WITHOUT CONTRAST  TECHNIQUE: Multiplanar, multisequence MR imaging of the knee was performed. No intravenous contrast was administered.  COMPARISON:  03/31/2017 radiographs  FINDINGS: MENISCI  Medial meniscus: Oblique free edge tear of the posterior horn of the medial meniscus series 7, image 27 and series 6, image 11.  Lateral meniscus:  Intact  LIGAMENTS  Cruciates:  Intact ACL and PCL.  Collaterals: Medial collateral ligament is intact. Lateral collateral ligament complex is intact.  CARTILAGE  Patellofemoral: Fissuring of the cartilage overlying the median ridge of the patella. Thinning of the cartilage overlying medial patellar  facet.  Medial: Mild partial-thickness cartilage loss of the medial femorotibial  compartment.  Lateral:  No chondral defect.  Joint: No joint effusion. No plical thickening. Mild induration of Hoffa's fat pad with trace fluid in the deep infrapatellar bursa.  Popliteal Fossa: Intrasubstance T2 signal within the distal semimembranosus muscle and tendon consistent with intramuscular strain and longitudinal intrasubstance tear of the distal tendon. Small ganglion noted more proximally, series 7, image 20. Trace fluid in the expected location of a popliteal cyst.  Extensor Mechanism:  Intact quadriceps tendon and patellar tendon.  Bones: Small foci of reactive marrow edema deep to the medial patellar facet, median ridge of the patella and trochlear groove secondary to chondrosis.  Other: Subcutaneous edema overlying the anterior aspect of the knee.  IMPRESSION: 1. Oblique free edge tear of the posterior horn of the medial meniscus. 2. Semimembranosus muscle strain with associated longitudinal intrasubstance tear of the distal tendon near its insertion. 3. Chondrosis of the patellofemoral compartment. 4. Deep infrapatellar bursitis.   Electronically Signed   By: Tollie Eth M.D.   On: 04/04/2017 13:31     No results found for this or any previous visit (from the past 48 hour(s)). No results found.    Assessment and Plan: 41 y.o. male with Right knee pain. Multifactorial pain. I do think a lot of the pain is coming from the chondrosis of the patellofemoral compartment. He may have some component of pain from the torn medial meniscus. Plan for trial steroid injection now. If not improving will refer for orthopedic surgical debridement for meniscus.  Visit contacted use in a Spanish interpreter  No orders of the defined types were placed in this encounter.  No orders of the defined types were placed in this encounter.   Discussed warning signs or symptoms. Please see discharge instructions. Patient expresses understanding.

## 2017-06-29 ENCOUNTER — Encounter (HOSPITAL_COMMUNITY): Payer: Self-pay | Admitting: *Deleted

## 2017-06-29 ENCOUNTER — Other Ambulatory Visit: Payer: Self-pay

## 2017-06-29 ENCOUNTER — Emergency Department (HOSPITAL_COMMUNITY): Payer: 59

## 2017-06-29 DIAGNOSIS — R079 Chest pain, unspecified: Secondary | ICD-10-CM | POA: Diagnosis not present

## 2017-06-29 DIAGNOSIS — I10 Essential (primary) hypertension: Secondary | ICD-10-CM | POA: Insufficient documentation

## 2017-06-29 DIAGNOSIS — Z79899 Other long term (current) drug therapy: Secondary | ICD-10-CM | POA: Diagnosis not present

## 2017-06-29 DIAGNOSIS — F419 Anxiety disorder, unspecified: Secondary | ICD-10-CM | POA: Diagnosis not present

## 2017-06-29 DIAGNOSIS — R0789 Other chest pain: Secondary | ICD-10-CM | POA: Diagnosis not present

## 2017-06-29 LAB — BASIC METABOLIC PANEL
Anion gap: 12 (ref 5–15)
BUN: 14 mg/dL (ref 6–20)
CHLORIDE: 99 mmol/L — AB (ref 101–111)
CO2: 26 mmol/L (ref 22–32)
Calcium: 9 mg/dL (ref 8.9–10.3)
Creatinine, Ser: 1 mg/dL (ref 0.61–1.24)
GFR calc non Af Amer: >60 mL/min (ref >60)
Glucose, Bld: 114 mg/dL — ABNORMAL HIGH (ref 65–99)
POTASSIUM: 3.4 mmol/L — AB (ref 3.5–5.1)
SODIUM: 137 mmol/L (ref 135–145)

## 2017-06-29 LAB — CBC
HEMATOCRIT: 42.6 % (ref 39.0–52.0)
Hemoglobin: 15.2 g/dL (ref 13.0–17.0)
MCH: 33.1 pg (ref 26.0–34.0)
MCHC: 35.7 g/dL (ref 30.0–36.0)
MCV: 92.8 fL (ref 78.0–100.0)
PLATELETS: 254 10*3/uL (ref 150–400)
RBC: 4.59 MIL/uL (ref 4.22–5.81)
RDW: 12.2 % (ref 11.5–15.5)
WBC: 10.7 10*3/uL — AB (ref 4.0–10.5)

## 2017-06-29 LAB — I-STAT TROPONIN, ED: Troponin i, poc: 0 ng/mL (ref 0.00–0.08)

## 2017-06-29 NOTE — ED Triage Notes (Signed)
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 Today started having pain again while driving to church. Reports SOB with productive cough x 3 days

## 2017-06-30 ENCOUNTER — Emergency Department (HOSPITAL_COMMUNITY)
Admission: EM | Admit: 2017-06-30 | Discharge: 2017-06-30 | Disposition: A | Discharge status: Home / Self Care | Payer: 59 | Attending: Emergency Medicine | Admitting: Emergency Medicine

## 2017-06-30 DIAGNOSIS — R0789 Other chest pain: Secondary | ICD-10-CM

## 2017-06-30 DIAGNOSIS — F419 Anxiety disorder, unspecified: Secondary | ICD-10-CM

## 2017-06-30 HISTORY — DX: Essential (primary) hypertension: I10

## 2017-06-30 LAB — I-STAT TROPONIN, ED: TROPONIN I, POC: 0 ng/mL (ref 0.00–0.08)

## 2017-06-30 MED ORDER — IPRATROPIUM-ALBUTEROL 0.5-2.5 (3) MG/3ML IN SOLN
3.0000 mL | Freq: Once | RESPIRATORY_TRACT | Status: AC
  Administered 2017-06-30: 3 mL via RESPIRATORY_TRACT
  Filled 2017-06-30: qty 3

## 2017-06-30 MED ORDER — METHOCARBAMOL 1000 MG/10ML IJ SOLN
500.0000 mg | Freq: Once | INTRAVENOUS | Status: AC
  Administered 2017-06-30: 500 mg via INTRAVENOUS
  Filled 2017-06-30: qty 5

## 2017-06-30 MED ORDER — METHOCARBAMOL 1000 MG/10ML IJ SOLN: 500.0000 mg | Freq: Once | INTRAMUSCULAR | Status: DC

## 2017-06-30 MED ORDER — ACETAMINOPHEN 500 MG PO TABS
1000.0000 mg | ORAL_TABLET | Freq: Once | ORAL | Status: AC
  Administered 2017-06-30: 1000 mg via ORAL
  Filled 2017-06-30: qty 2

## 2017-06-30 MED ORDER — LORAZEPAM 2 MG/ML IJ SOLN
1.0000 mg | Freq: Once | INTRAMUSCULAR | Status: AC
  Administered 2017-06-30: 1 mg via INTRAVENOUS
  Filled 2017-06-30: qty 1

## 2017-06-30 NOTE — Discharge Instructions (Signed)
Your lab work, chest x-ray, EKG were normal today and similar to previous ED visits.   You have a low cardiac risk profile. However, given your recurrent symptoms I think you should follow up with cardiology to rule out underlying cardiac problems. Call cardiology and schedule an appointment for further evaluation.   Your cough and anxiety may be contributing to your symptoms.  Take a cough medicine and your anxiety medications.   Return for exertional chest pain, shortness of breath, palpitations, light-headedness or any other concerning symptoms.

## 2017-06-30 NOTE — ED Provider Notes (Signed)
[facility] EMERGENCY DEPARTMENT Provider Note   CSN: [id number] Arrival date & time: 06/29/17  1927     History   Chief Complaint Chief Complaint  Patient presents with  . Chest Pain    HPI Bryan [name] is a 41 y.o. male with history of anxiety and depression presents to the ED for evaluation of intermittent, tight left sided chest pain that radiates to neck, trapezius and left shoulder. Chest pain began yesterday while he was driving, he had 2 episodes of it today while working. Each episode lasts approximately 30 minutes. Chest pain occurs while working, exerting himself and also at rest. Pain is worse with palpation and active range of motion of bilateral extremities. Feels like his muscles are tight. Associated symptoms include shortness of breath that he describes as "having to take deeper breaths" and feeling like when he takes a deep breath and he tries to exhale the air doesn't come out completely. Currently endorsing chest pain and shortness of breath and nausea. Does report he is anxious and nervous currently. Also reports a lingering cough with phlegm for the last 4 days. Four previous ED visits hit year for chest pain and shortness of breath. Previous providers have documented high suspicion for anxiety. States his PCP has prescribed him anxiety medication that he does not want to take.   No fevers, chills, vomiting, abdominal pain, back pain. No known history of cardiac or pulmonary issues. No previous history of DVT/PE. No HTN, HLD, tobacco abuse, family hx of early CAD. No IVDU.  HPI  Past Medical History:  Diagnosis Date  . Chronic back pain   . Hypertension     Patient Active Problem List   Diagnosis Date Noted  . Right knee pain 03/31/2017  . Arthritis of knee, right 03/31/2017  . Essential (primary) hypertension 02/05/2017  . Family history of diabetes mellitus 10/08/2016  . Left knee pain 01/10/2016  . Plantar fasciitis of  left foot 01/10/2016  . Right shoulder pain 01/10/2016  . Constipation 12/17/2011    Past Surgical History:  Procedure Laterality Date  . APPENDECTOMY         Home Medications    Prior to Admission medications   Medication Sig Start Date End Date Taking? Authorizing Provider  diclofenac sodium (VOLTAREN) 1 % GEL Apply 4 g topically 4 (four) times daily. To affected joint. 03/31/17   Rodolph Bong, MD  naproxen (NAPROSYN) 500 MG tablet Take 1 tablet (500 mg total) by mouth 2 (two) times daily with a meal. 10/08/16   Doristine Bosworth, MD    Family History Family History  Problem Relation Age of Onset  . Cancer Mother   . Diabetes Mother   . Diabetes Father     Social History Social History   Tobacco Use  . Smoking status: Never Smoker  . Smokeless tobacco: Never Used  Substance Use Topics  . Alcohol use: No    Alcohol/week: 0.0 oz  . Drug use: No     Allergies   Patient has no known allergies.   Review of Systems Review of Systems  Respiratory: Positive for shortness of breath.   Cardiovascular: Positive for chest pain.  Gastrointestinal: Positive for nausea.  Psychiatric/Behavioral: The patient is nervous/anxious.   All other systems reviewed and are negative.    Physical Exam Updated Vital Signs BP (!) 109/51   Pulse 79   Temp 98.7 F (37.1 C) (Oral)   Resp 17   SpO2 94%  Physical Exam  Constitutional: He appears well-developed and well-nourished.  NAD.  HENT:  Head: Normocephalic and atraumatic.  Nose: Nose normal.  Moist mucous membranes Tonsils and oropharynx normal  Eyes: Conjunctivae, EOM and lids are normal.  Neck: Trachea normal and normal range of motion.  Neck is supple Trachea midline No cervical adenopathy  Cardiovascular: Normal rate, regular rhythm, S1 normal, S2 normal and normal heart sounds.  Pulses:      Carotid pulses are 2+ on the right side, and 2+ on the left side.      Radial pulses are 2+ on the right side, and 2+  on the left side.       Dorsalis pedis pulses are 2+ on the right side, and 2+ on the left side.  RRR No S3 No orthopnea No LE edema No carotid bruits  Pulmonary/Chest: Effort normal and breath sounds normal. No respiratory distress. He has no decreased breath sounds. He has no wheezes. He has no rhonchi. He has no rales. He exhibits tenderness.  CP not reproducible with AROM of upper extremities    Abdominal: Soft. Bowel sounds are normal. There is no tenderness.  No epigastric tenderness  Lymphadenopathy:    He has no cervical adenopathy.  Neurological: He is alert. GCS eye subscore is 4. GCS verbal subscore is 5. GCS motor subscore is 6.  Skin: Skin is warm and dry. Capillary refill takes less than 2 seconds.  Psychiatric: He has a normal mood and affect. His speech is normal and behavior is normal. Judgment and thought content normal. Cognition and memory are normal.     ED Treatments / Results  Labs (all labs ordered are listed, but only abnormal results are displayed) Labs Reviewed  BASIC METABOLIC PANEL - Abnormal; Notable for the following components:      Result Value   Potassium 3.4 (*)    Chloride 99 (*)    Glucose, Bld 114 (*)    All other components within normal limits  CBC - Abnormal; Notable for the following components:   WBC 10.7 (*)    All other components within normal limits  I-STAT TROPONIN, ED  I-STAT TROPONIN, ED    EKG  EKG Interpretation None       Radiology Dg Chest 2 View  Result Date: 06/29/2017 CLINICAL DATA:  Chest pain EXAM: CHEST  2 VIEW COMPARISON:  February 07, 2017 FINDINGS: Lungs are clear. Heart size and pulmonary vascularity are normal. No adenopathy. No pneumothorax. No bone lesions. IMPRESSION: No edema or consolidation. Electronically Signed   By: Bretta BangWilliam  Woodruff III M.D.   On: 06/29/2017 21:14    Procedures Procedures (including critical care time)  Medications Ordered in ED Medications  acetaminophen (TYLENOL) tablet  1,000 mg (1,000 mg Oral Given 06/30/17 0158)  ipratropium-albuterol (DUONEB) 0.5-2.5 (3) MG/3ML nebulizer solution 3 mL (3 mLs Nebulization Given 06/30/17 0158)  LORazepam (ATIVAN) injection 1 mg (1 mg Intravenous Given 06/30/17 0158)  methocarbamol (ROBAXIN) 500 mg in dextrose 5 % 50 mL IVPB (0 mg Intravenous Stopped 06/30/17 0300)     Initial Impression / Assessment and Plan / ED Course  I have reviewed the triage vital signs and the nursing notes.  Pertinent labs & imaging results that were available during my care of the patient were reviewed by me and considered in my medical decision making (see chart for details).     Pt is a 41 y.o. male presents with CP.  Symptoms have been intermittent. Exertional and at rest, worse  with palpation. No pertinent risk factors including HTN, hypercholesterolemia, DM, obesity, smoking, positive family hx, known CAD (preious MI, PCI/CABG, CVA/TIA or PAD).  HEART score = 2. PERC negative. On exam VS are wnl. Cardiovascular and pulmonary exam benign. CXR, EKG, troponin x 2 within normal limits.  CBC and BMP unremarkable.  Pt was given lorazapam, tylenol and duoneb in ED. Has four ED visits for CP and SOB. Previous EDPA documented suspicion for anxiety. Has been evaluated by PCP for CP and SOB who also suspect anxiety vs muscular etiology. 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 He admits to feeling anxious currently.  Does not want to take anxiety med prescribed to him. ED tx partially relieved symptoms today. Patient has ambulated and tolerated PO in ED. Given symptoms, reassuring ED work up, low risk HEART score patient will be discharged with recommendation to follow up with PCP and cardiologist in regards to today's hospital visit. ED return preacutions given. Pt appears reliable for follow up and is agreeable to discharge.    Final Clinical Impressions(s) / ED Diagnoses   Final diagnoses:  Atypical chest pain  Anxiety    ED  Discharge Orders    None       Liberty HandyGibbons, Claudia J, PA-C 06/30/17 0344    Ward, Layla MawKristen N, DO 06/30/17 [phone]

## 2017-07-20 DIAGNOSIS — R0602 Shortness of breath: Secondary | ICD-10-CM | POA: Diagnosis not present

## 2017-07-20 DIAGNOSIS — R079 Chest pain, unspecified: Secondary | ICD-10-CM | POA: Diagnosis not present

## 2017-07-20 DIAGNOSIS — I10 Essential (primary) hypertension: Secondary | ICD-10-CM | POA: Diagnosis not present

## 2017-07-20 DIAGNOSIS — R7989 Other specified abnormal findings of blood chemistry: Secondary | ICD-10-CM | POA: Diagnosis not present

## 2017-07-20 DIAGNOSIS — R000 Tachycardia, unspecified: Secondary | ICD-10-CM | POA: Diagnosis not present

## 2017-07-21 DIAGNOSIS — R079 Chest pain, unspecified: Secondary | ICD-10-CM | POA: Diagnosis not present

## 2017-07-21 DIAGNOSIS — R7989 Other specified abnormal findings of blood chemistry: Secondary | ICD-10-CM | POA: Diagnosis not present

## 2017-07-21 DIAGNOSIS — R0602 Shortness of breath: Secondary | ICD-10-CM | POA: Diagnosis not present

## 2017-07-21 DIAGNOSIS — R000 Tachycardia, unspecified: Secondary | ICD-10-CM | POA: Diagnosis not present

## 2017-09-16 ENCOUNTER — Other Ambulatory Visit: Payer: Self-pay

## 2017-09-16 ENCOUNTER — Emergency Department (HOSPITAL_COMMUNITY): Payer: 59

## 2017-09-16 ENCOUNTER — Emergency Department (HOSPITAL_COMMUNITY)
Admission: EM | Admit: 2017-09-16 | Discharge: 2017-09-16 | Disposition: A | Discharge status: Home / Self Care | Payer: 59 | Attending: Emergency Medicine | Admitting: Emergency Medicine

## 2017-09-16 ENCOUNTER — Encounter (HOSPITAL_COMMUNITY): Payer: Self-pay | Admitting: Emergency Medicine

## 2017-09-16 DIAGNOSIS — R0602 Shortness of breath: Secondary | ICD-10-CM | POA: Insufficient documentation

## 2017-09-16 DIAGNOSIS — R0789 Other chest pain: Secondary | ICD-10-CM | POA: Diagnosis not present

## 2017-09-16 DIAGNOSIS — R110 Nausea: Secondary | ICD-10-CM | POA: Diagnosis not present

## 2017-09-16 DIAGNOSIS — I10 Essential (primary) hypertension: Secondary | ICD-10-CM | POA: Diagnosis not present

## 2017-09-16 DIAGNOSIS — Z79899 Other long term (current) drug therapy: Secondary | ICD-10-CM | POA: Insufficient documentation

## 2017-09-16 LAB — I-STAT TROPONIN, ED
TROPONIN I, POC: 0 ng/mL (ref 0.00–0.08)
Troponin i, poc: 0 ng/mL (ref 0.00–0.08)

## 2017-09-16 LAB — D-DIMER, QUANTITATIVE (NOT AT ARMC)

## 2017-09-16 LAB — BASIC METABOLIC PANEL
Anion gap: 10 (ref 5–15)
BUN: 13 mg/dL (ref 6–20)
CALCIUM: 9 mg/dL (ref 8.9–10.3)
CHLORIDE: 100 mmol/L — AB (ref 101–111)
CO2: 26 mmol/L (ref 22–32)
Creatinine, Ser: 0.88 mg/dL (ref 0.61–1.24)
Glucose, Bld: 116 mg/dL — ABNORMAL HIGH (ref 65–99)
POTASSIUM: 3.5 mmol/L (ref 3.5–5.1)
Sodium: 136 mmol/L (ref 135–145)

## 2017-09-16 LAB — CBC
HCT: 41.7 % (ref 39.0–52.0)
HEMOGLOBIN: 15.3 g/dL (ref 13.0–17.0)
MCH: 34.2 pg — ABNORMAL HIGH (ref 26.0–34.0)
MCHC: 36.7 g/dL — ABNORMAL HIGH (ref 30.0–36.0)
MCV: 93.1 fL (ref 78.0–100.0)
Platelets: 281 10*3/uL (ref 150–400)
RBC: 4.48 MIL/uL (ref 4.22–5.81)
RDW: 12.2 % (ref 11.5–15.5)
WBC: 8.9 10*3/uL (ref 4.0–10.5)

## 2017-09-16 MED ORDER — KETOROLAC TROMETHAMINE 30 MG/ML IJ SOLN
30.0000 mg | Freq: Once | INTRAMUSCULAR | Status: DC
  Filled 2017-09-16: qty 1

## 2017-09-16 MED ORDER — NAPROXEN 500 MG PO TABS: 500.0000 mg | ORAL_TABLET | Freq: Two times a day (BID) | ORAL | 0 refills | Status: DC

## 2017-09-16 NOTE — ED Triage Notes (Signed)
Pt complaint of left chest pain onset 0900; associated SOB and upper back pain.

## 2017-09-16 NOTE — ED Provider Notes (Signed)
[facility] Provider Note   CSN: [id number] Arrival date & time: 09/16/17  1230     History   Chief Complaint Chief Complaint  Patient presents with  . Chest Pain    HPI Bryan [name] is a 42 y.o. male.  Patient is a 42 year old male with a history of hypertension and back pain who presents with chest pain.  He states he was at work today and had onset of pain to his left chest.  Describes as a muscle type pain.  Its not worse with movement.  It is not exertional.  He had some associated shortness of breath and nausea.  He denies any radiation of the chest pain.  He is not taking anything for the pain prior to arrival.  He denies any history of heart disease.  There is no family history of early heart disease.  He is a non-smoker.  He has no known history of hyperlipidemia.  He has been seen multiple times in the past for similar type chest pain.  He states that some points, it was related to anxiety and improved with Ativan.  He also feels like it may be related to his hypertension.  He was seen about a month ago by his PCP and has a follow-up appointment next week.  He has not discussed having a stress test.  He denies any leg pain or swelling.  No history of VTE.      Past Medical History:  Diagnosis Date  . Chronic back pain   . Hypertension     Patient Active Problem List   Diagnosis Date Noted  . Right knee pain 03/31/2017  . Arthritis of knee, right 03/31/2017  . Essential (primary) hypertension 02/05/2017  . Family history of diabetes mellitus 10/08/2016  . Left knee pain 01/10/2016  . Plantar fasciitis of left foot 01/10/2016  . Right shoulder pain 01/10/2016  . Constipation 12/17/2011    Past Surgical History:  Procedure Laterality Date  . APPENDECTOMY         Home Medications    Prior to Admission medications   Medication Sig Start Date End Date Taking? Authorizing Provider  escitalopram (LEXAPRO) 10 MG  tablet Take 10 mg by mouth daily.   Yes [provider]  lisinopril (PRINIVIL,ZESTRIL) 10 MG tablet Take 10 mg by mouth daily.   Yes [provider]  Multiple Vitamin (MULTIVITAMIN WITH MINERALS) TABS tablet Take 1 tablet by mouth daily.   Yes [provider]  diclofenac sodium (VOLTAREN) 1 % GEL Apply 4 g topically 4 (four) times daily. To affected joint. Patient not taking: Reported on 09/16/2017 03/31/17   Rodolph Bong, MD  naproxen (NAPROSYN) 500 MG tablet Take 1 tablet (500 mg total) by mouth 2 (two) times daily with a meal. 09/16/17   Rolan Bucco, MD    Family History Family History  Problem Relation Age of Onset  . Cancer Mother   . Diabetes Mother   . Diabetes Father     Social History Social History   Tobacco Use  . Smoking status: Never Smoker  . Smokeless tobacco: Never Used  Substance Use Topics  . Alcohol use: No    Alcohol/week: 0.0 oz  . Drug use: No     Allergies   Patient has no known allergies.   Review of Systems Review of Systems  Constitutional: Negative for chills, diaphoresis, fatigue and fever.  HENT: Negative for congestion, rhinorrhea and sneezing.   Eyes: Negative.  Respiratory: Positive for shortness of breath. Negative for cough and chest tightness.   Cardiovascular: Positive for chest pain. Negative for leg swelling.  Gastrointestinal: Positive for nausea. Negative for abdominal pain, blood in stool, diarrhea and vomiting.  Genitourinary: Negative for difficulty urinating, flank pain, frequency and hematuria.  Musculoskeletal: Negative for arthralgias and back pain.  Skin: Negative for rash.  Neurological: Negative for dizziness, speech difficulty, weakness, numbness and headaches.     Physical Exam Updated Vital Signs BP (!) 134/94 (BP Location: Left Arm)   Pulse 83   Temp 98.3 F (36.8 C) (Oral)   Resp 19   SpO2 97%   Physical Exam  Constitutional: He is oriented to person, place, and time. He  appears well-developed and well-nourished.  HENT:  Head: Normocephalic and atraumatic.  Eyes: Pupils are equal, round, and reactive to light.  Neck: Normal range of motion. Neck supple.  Cardiovascular: Normal rate, regular rhythm and normal heart sounds.  Positive reproducible pain to the left anterior chest wall.  There is also some tenderness to his left upper back just lateral to the thoracic spine.  No crepitus or deformity.  No spinal tenderness is noted.  Pulmonary/Chest: Effort normal and breath sounds normal. No respiratory distress. He has no wheezes. He has no rales. He exhibits no tenderness.  Abdominal: Soft. Bowel sounds are normal. There is no tenderness. There is no rebound and no guarding.  Musculoskeletal: Normal range of motion. He exhibits no edema.  Lymphadenopathy:    He has no cervical adenopathy.  Neurological: He is alert and oriented to person, place, and time.  Skin: Skin is warm and dry. No rash noted.  Psychiatric: He has a normal mood and affect.     ED Treatments / Results  Labs (all labs ordered are listed, but only abnormal results are displayed) Labs Reviewed  BASIC METABOLIC PANEL - Abnormal; Notable for the following components:      Result Value   Chloride 100 (*)    Glucose, Bld 116 (*)    All other components within normal limits  CBC - Abnormal; Notable for the following components:   MCH 34.2 (*)    MCHC 36.7 (*)    All other components within normal limits  D-DIMER, QUANTITATIVE (NOT AT [facility])  I-STAT TROPONIN, ED  I-STAT TROPONIN, ED    EKG  EKG Interpretation  Date/Time:  Friday September 16 2017 12:42:23 EST Ventricular Rate:  119 PR Interval:    QRS Duration: 91 QT Interval:  315 QTC Calculation: 444 R Axis:   78 Text Interpretation:  Sinus tachycardia Baseline wander in lead(s) I V1 since last tracing no significant change Confirmed by Rolan Bucco [phone]) on 09/16/2017 4:51:23 PM       Radiology Dg Chest 2  View  Result Date: 09/16/2017 CLINICAL DATA:  Shortness of breath. EXAM: CHEST  2 VIEW COMPARISON:  06/29/2017 FINDINGS: The cardiomediastinal silhouette is unchanged and within normal limits. The patient has taken a shallower inspiration than on the prior study. No confluent airspace opacity, edema, pleural effusion, or pneumothorax is identified. No acute osseous abnormality is seen. IMPRESSION: No active cardiopulmonary disease. Electronically Signed   By: Sebastian Ache M.D.   On: 09/16/2017 13:58    Procedures Procedures (including critical care time)  Medications Ordered in ED Medications  ketorolac (TORADOL) 30 MG/ML injection 30 mg (30 mg Intravenous Refused 09/16/17 1847)     Initial Impression / Assessment and Plan / ED Course  I have reviewed the  triage vital signs and the nursing notes.  Pertinent labs & imaging results that were available during my care of the patient were reviewed by me and considered in my medical decision making (see chart for details).     Patient is a 42 year old male who presents with chest pain.  It is fairly atypical and has had similar pain in the past.  Reproducible on palpation.  He has a low heart score.  He has had 2- troponins.  There is no suggestions of pulmonary embolus.  His d-dimer is negative.  His chest x-ray is clear without evidence of pneumonia or pneumothorax.  He has normal oxygen saturations on ambulation.  He lungs are clear to auscultation.  He was discharged home in good condition.  He was given a prescription for Naprosyn.  He was encouraged to follow-up with his PCP.  He has had several visits for chest pain over the last several months.  Given this, it may be prudent to have an outpatient stress test which I advised him to discuss with his PCP.  Return precautions were given.  History and instructions were done through the video language line.  Final Clinical Impressions(s) / ED Diagnoses   Final diagnoses:  Atypical chest pain     ED Discharge Orders        Ordered    naproxen (NAPROSYN) 500 MG tablet  2 times daily with meals     09/16/17 1951       Rolan BuccoBelfi, Damario Gillie, MD 09/16/17 1955

## 2017-10-07 DIAGNOSIS — E785 Hyperlipidemia, unspecified: Secondary | ICD-10-CM | POA: Diagnosis not present

## 2017-10-07 DIAGNOSIS — R0789 Other chest pain: Secondary | ICD-10-CM | POA: Diagnosis not present

## 2017-10-07 DIAGNOSIS — I10 Essential (primary) hypertension: Secondary | ICD-10-CM | POA: Diagnosis not present

## 2018-01-13 DIAGNOSIS — E785 Hyperlipidemia, unspecified: Secondary | ICD-10-CM | POA: Diagnosis not present

## 2018-01-13 DIAGNOSIS — R0789 Other chest pain: Secondary | ICD-10-CM | POA: Diagnosis not present

## 2018-01-13 DIAGNOSIS — I10 Essential (primary) hypertension: Secondary | ICD-10-CM | POA: Diagnosis not present

## 2018-04-14 DIAGNOSIS — R0789 Other chest pain: Secondary | ICD-10-CM | POA: Diagnosis not present

## 2018-04-14 DIAGNOSIS — I10 Essential (primary) hypertension: Secondary | ICD-10-CM | POA: Diagnosis not present

## 2018-04-14 DIAGNOSIS — E785 Hyperlipidemia, unspecified: Secondary | ICD-10-CM | POA: Diagnosis not present

## 2018-09-29 DIAGNOSIS — E785 Hyperlipidemia, unspecified: Secondary | ICD-10-CM | POA: Diagnosis not present

## 2018-09-29 DIAGNOSIS — I10 Essential (primary) hypertension: Secondary | ICD-10-CM | POA: Diagnosis not present

## 2018-11-08 DIAGNOSIS — J209 Acute bronchitis, unspecified: Secondary | ICD-10-CM | POA: Diagnosis not present

## 2019-06-11 ENCOUNTER — Other Ambulatory Visit: Payer: Self-pay

## 2019-06-11 DIAGNOSIS — Z20822 Contact with and (suspected) exposure to covid-19: Secondary | ICD-10-CM

## 2019-06-13 LAB — NOVEL CORONAVIRUS, NAA: SARS-CoV-2, NAA: DETECTED — AB

## 2019-06-25 ENCOUNTER — Other Ambulatory Visit: Payer: Self-pay

## 2019-06-25 DIAGNOSIS — Z20822 Contact with and (suspected) exposure to covid-19: Secondary | ICD-10-CM

## 2019-06-26 LAB — NOVEL CORONAVIRUS, NAA: SARS-CoV-2, NAA: NOT DETECTED

## 2019-07-07 IMAGING — CR DG CHEST 2V
2 series · 2 of 2 positions shown · non-contrast
Comparison: February 07, 2017

CLINICAL DATA: Chest pain

EXAM:
CHEST  2 VIEW

[chest pa]
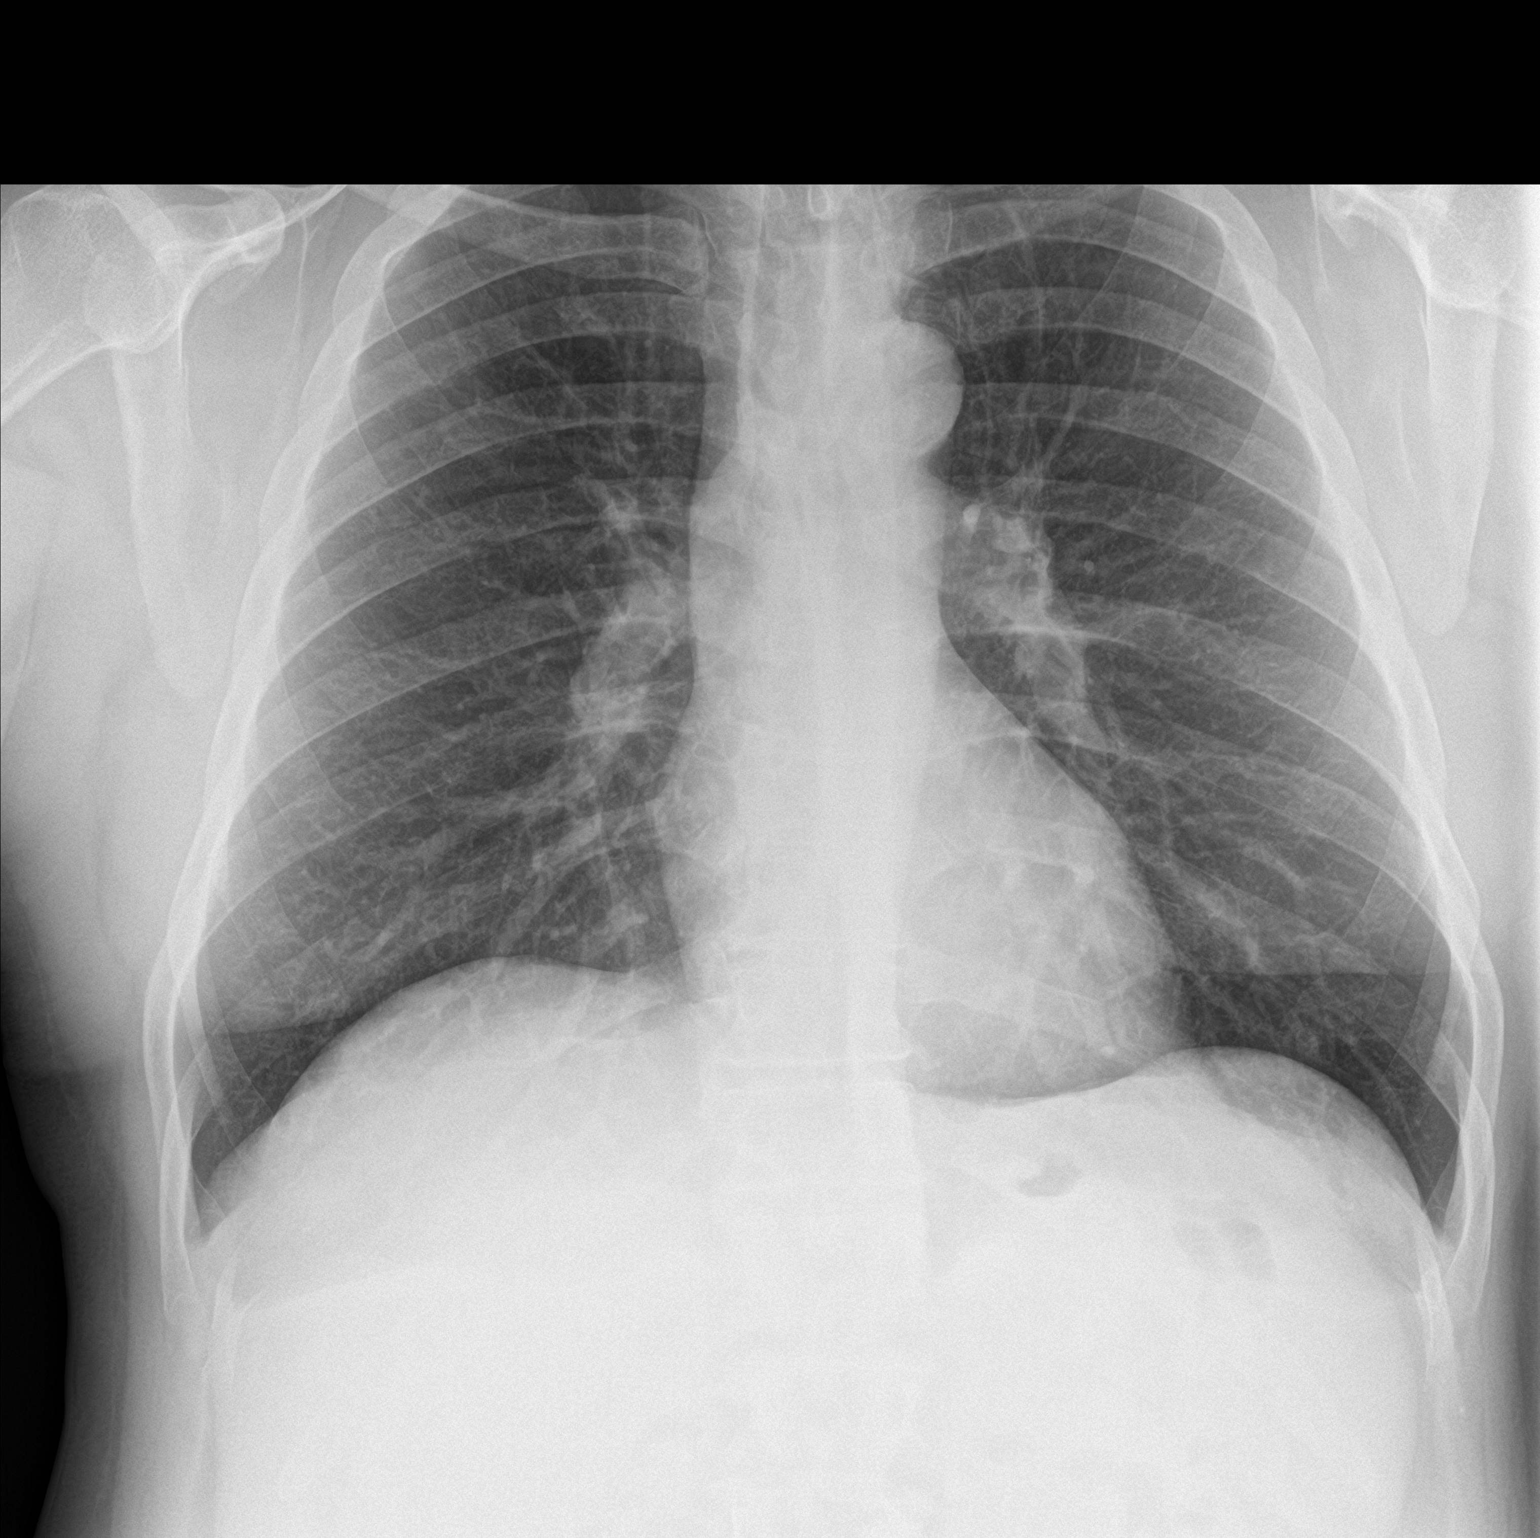

[chest lat]
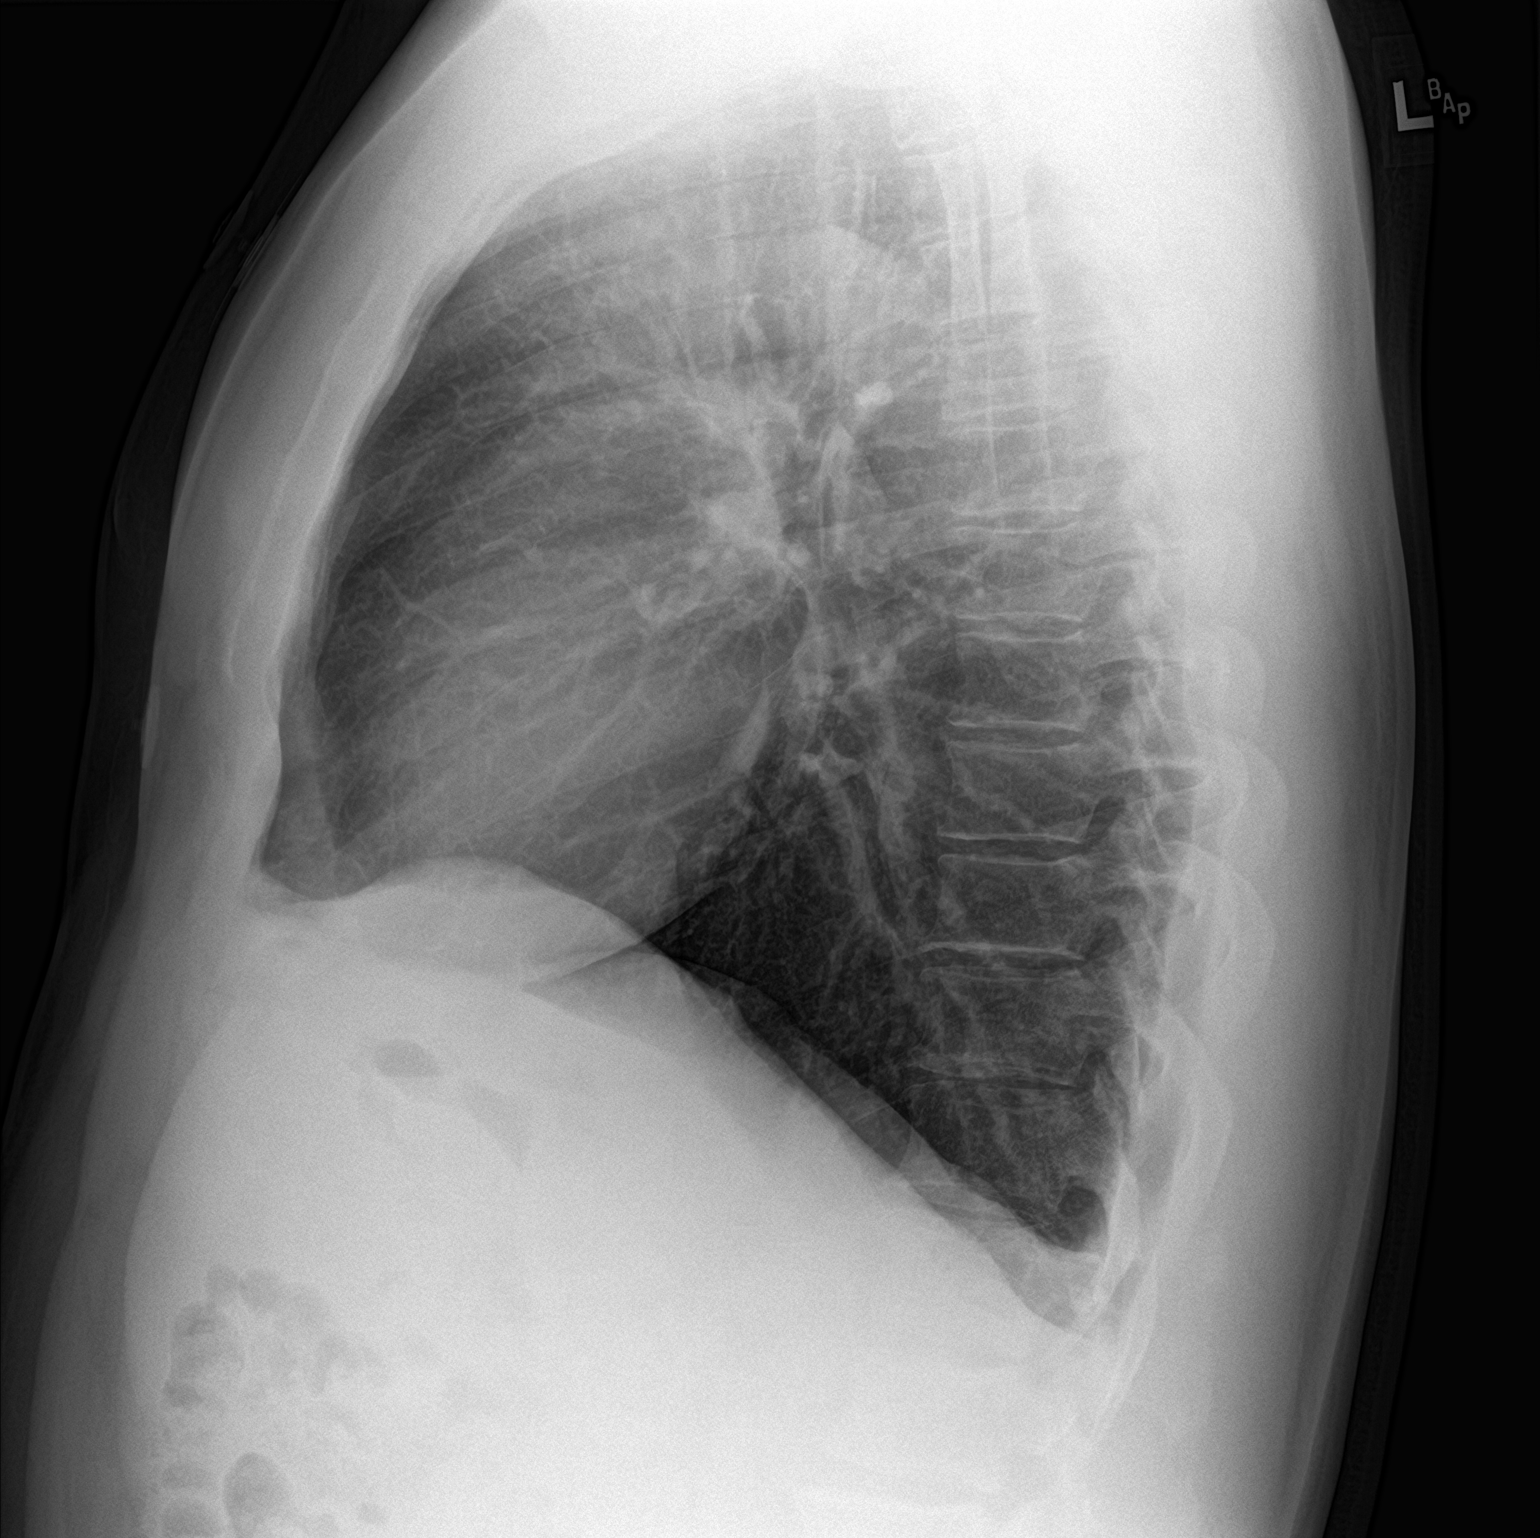

[2 of 2 positions shown; findings below may reference images not displayed]

FINDINGS: Lungs are clear. Heart size and pulmonary vascularity are normal. No
adenopathy. No pneumothorax. No bone lesions.
IMPRESSION: No edema or consolidation.

## 2019-09-24 IMAGING — CR DG CHEST 2V
2 series · 2 of 2 positions shown · non-contrast
Comparison: 06/29/2017

CLINICAL DATA: Shortness of breath.

EXAM:
CHEST  2 VIEW

[w chest pa]
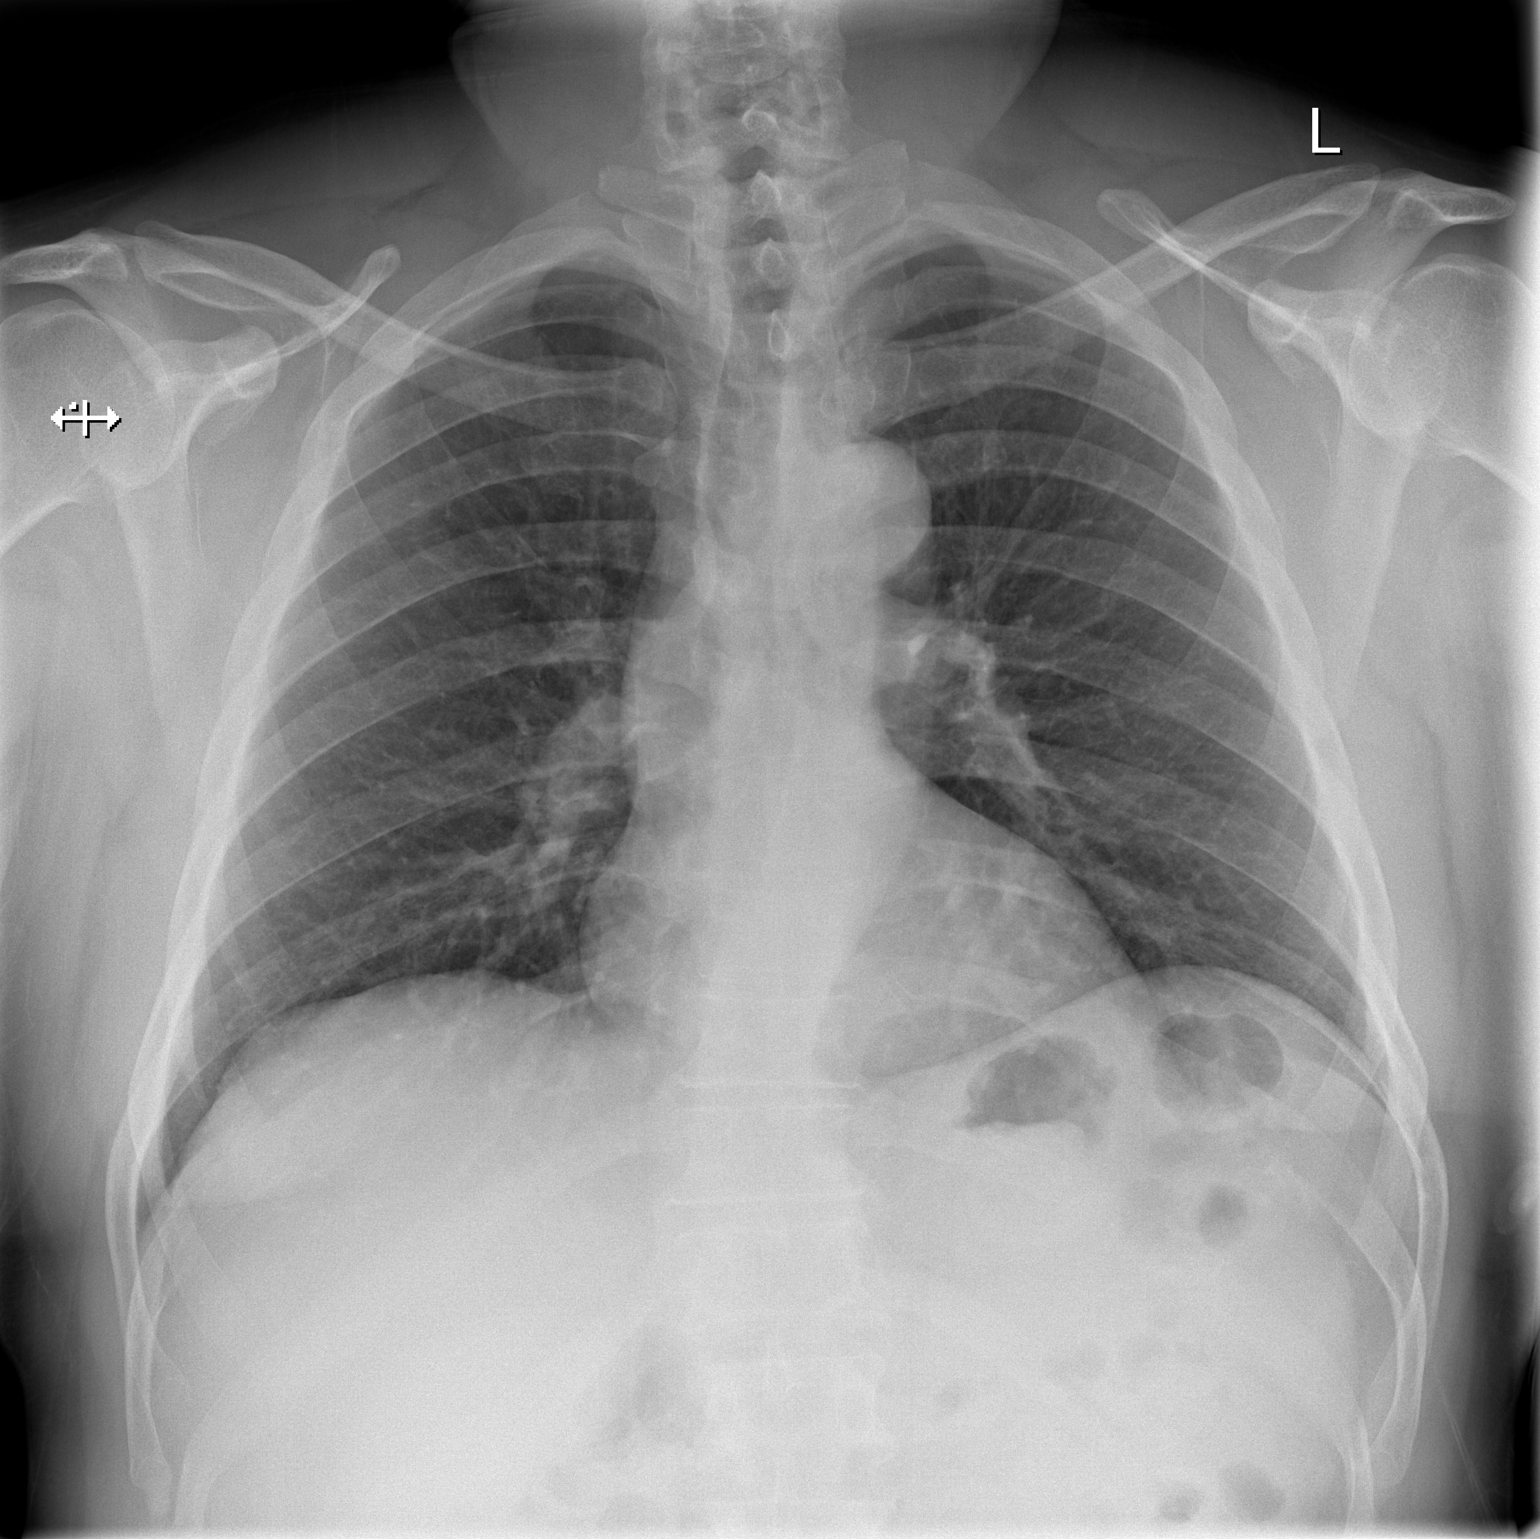

[w chest lat]
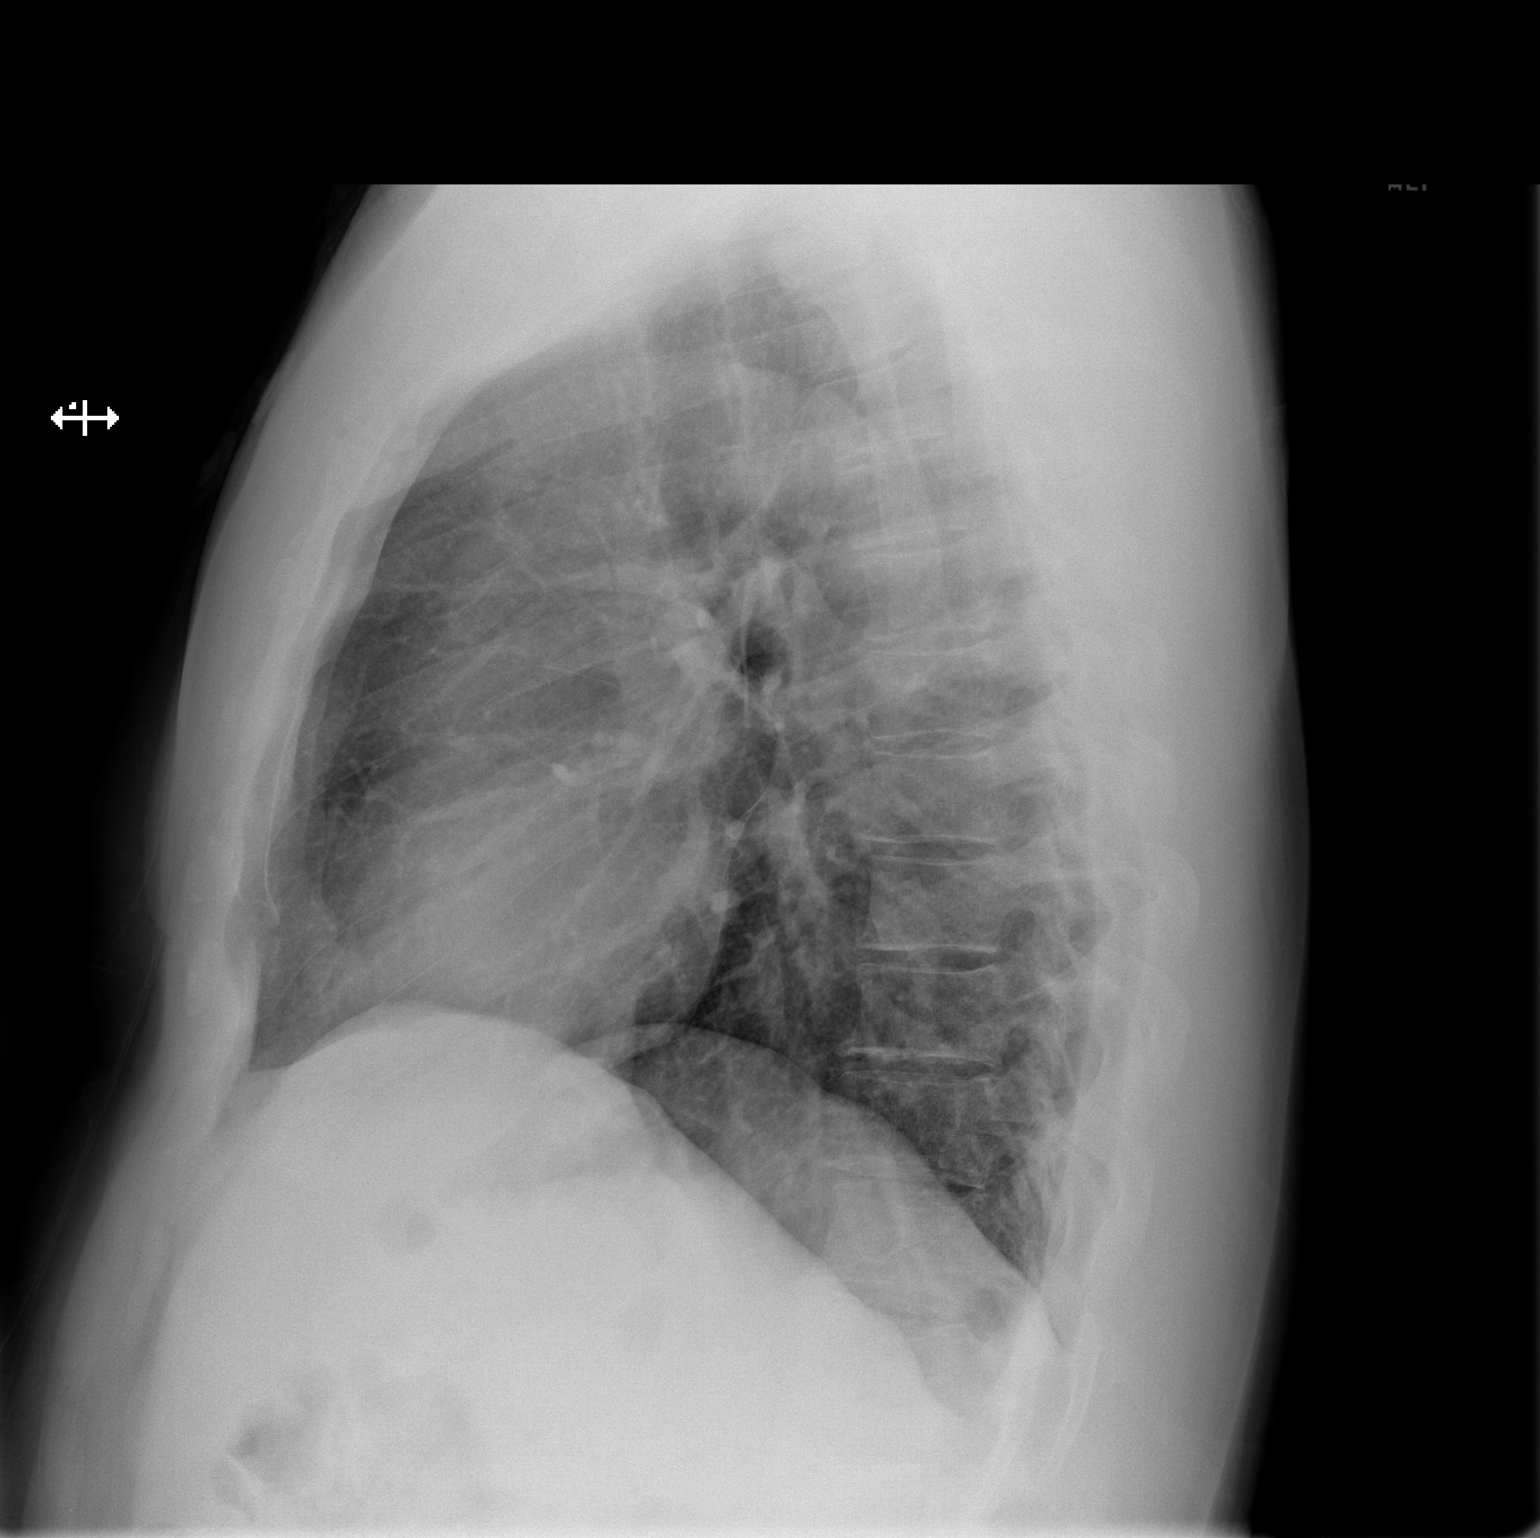

[2 of 2 positions shown; findings below may reference images not displayed]

FINDINGS: The cardiomediastinal silhouette is unchanged and within normal
limits. The patient has taken a shallower inspiration than on the
prior study. No confluent airspace opacity, edema, pleural effusion,
or pneumothorax is identified. No acute osseous abnormality is seen.
IMPRESSION: No active cardiopulmonary disease.

## 2019-10-28 ENCOUNTER — Emergency Department (HOSPITAL_COMMUNITY)
Admission: EM | Admit: 2019-10-28 | Discharge: 2019-10-28 | Disposition: A | Discharge status: Home / Self Care | Payer: 59 | Attending: Emergency Medicine | Admitting: Emergency Medicine

## 2019-10-28 ENCOUNTER — Emergency Department (HOSPITAL_COMMUNITY): Payer: 59

## 2019-10-28 ENCOUNTER — Other Ambulatory Visit: Payer: Self-pay

## 2019-10-28 ENCOUNTER — Encounter (HOSPITAL_COMMUNITY): Payer: Self-pay | Admitting: Emergency Medicine

## 2019-10-28 DIAGNOSIS — R079 Chest pain, unspecified: Secondary | ICD-10-CM | POA: Diagnosis not present

## 2019-10-28 DIAGNOSIS — M542 Cervicalgia: Secondary | ICD-10-CM | POA: Insufficient documentation

## 2019-10-28 DIAGNOSIS — Z79899 Other long term (current) drug therapy: Secondary | ICD-10-CM | POA: Insufficient documentation

## 2019-10-28 DIAGNOSIS — I10 Essential (primary) hypertension: Secondary | ICD-10-CM | POA: Diagnosis not present

## 2019-10-28 LAB — CBC
HCT: 45.2 % (ref 39.0–52.0)
Hemoglobin: 16 g/dL (ref 13.0–17.0)
MCH: 33.6 pg (ref 26.0–34.0)
MCHC: 35.4 g/dL (ref 30.0–36.0)
MCV: 95 fL (ref 80.0–100.0)
Platelets: 273 10*3/uL (ref 150–400)
RBC: 4.76 MIL/uL (ref 4.22–5.81)
RDW: 12.4 % (ref 11.5–15.5)
WBC: 8.4 10*3/uL (ref 4.0–10.5)
nRBC: 0 % (ref 0.0–0.2)

## 2019-10-28 LAB — BASIC METABOLIC PANEL
Anion gap: 9 (ref 5–15)
BUN: 16 mg/dL (ref 6–20)
CO2: 26 mmol/L (ref 22–32)
Calcium: 9.1 mg/dL (ref 8.9–10.3)
Chloride: 100 mmol/L (ref 98–111)
Creatinine, Ser: 0.76 mg/dL (ref 0.61–1.24)
GFR calc Af Amer: >60 mL/min (ref >60)
GFR calc non Af Amer: >60 mL/min (ref >60)
Glucose, Bld: 116 mg/dL — ABNORMAL HIGH (ref 70–99)
Potassium: 3.7 mmol/L (ref 3.5–5.1)
Sodium: 135 mmol/L (ref 135–145)

## 2019-10-28 LAB — TROPONIN I (HIGH SENSITIVITY): Troponin I (High Sensitivity): <2 ng/L (ref <18)

## 2019-10-28 MED ORDER — SODIUM CHLORIDE 0.9% FLUSH
3.0000 mL | Freq: Once | INTRAVENOUS | Status: AC
  Administered 2019-10-28: 3 mL via INTRAVENOUS

## 2019-10-28 MED ORDER — METOPROLOL SUCCINATE ER 25 MG PO TB24
25.0000 mg | ORAL_TABLET | Freq: Every day | ORAL | Status: DC
  Filled 2019-10-28: qty 1

## 2019-10-28 MED ORDER — ASPIRIN 81 MG PO CHEW
81.0000 mg | CHEWABLE_TABLET | Freq: Once | ORAL | Status: DC
  Filled 2019-10-28: qty 1

## 2019-10-28 NOTE — Discharge Instructions (Signed)
Please continue taking your blood pressure medicine at home, and your other medications.  Please call Dr Annitta Jersey office tomorrow to ask for a follow up appointment this week.  You can tell them that the ER doctor sent you, and that I spoke to Dr. Algie Coffer (another cardiologist with their group).

## 2019-10-28 NOTE — ED Provider Notes (Signed)
[facility]-EMERGENCY DEPT Provider Note   CSN: [id number] Arrival date & time: 10/28/19  1345   History Chief Complaint  Patient presents with  . Chest Pain    Bryan [name] Bryan [name] is a 44 y.o. male w/ hx of HTN, chronic back pain, presenting to the ED with neck and chest pain.  The patient is spanish speaking but his son is able to provide additional translation at bedside.  He has been having episodes of chest pain and neck pain intermittently for "years," and has been seen in the ED multiple times for this since 2018.  He follows with Dr Sharyn Lull for cardiology and was seen "about 3 months ago" in the office, but says he did not have a stress test or cardiac testing aside from an ECG done.  He takes BP meds normally but has been off them for about 2-4 weeks.  He is not a smoker and is unaware of hx of HLD.    He presents today complaining of 4 days of persistently worsening chest pain.  He notices a substernal pressure with pain into his upper neck, particularly when he is working.  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   He most recently had CP while walking into the ED, but it has since gone away.  He tells me he sleeps pain-free at night, but really only notices his symptoms when he is walking or intensifying when he is doing work.  He has had episodes of pain for "weeks."  Currently he is PAIN FREE.  He denies SOB, lightheadedness, fevers, chills, congestion, coughing.  Per careeverywhere records he had CTA PE study at [facility] on 07/21/2017 which showed no PE, no significant stenosis of the aorta.    Fam hx of MI in his great-grandparents at unclear age.  No other significant family hx.  No hemoptysis or asymmetric LE edema. Patient denies personal or family history of DVT or PE. No recent hormone use (including OCP); travel for >6 hours; prolonged immobilization for greater than 3 days; surgeries or trauma in  the last 4 weeks; or malignancy with treatment within 6 months.   HPI     Past Medical History:  Diagnosis Date  . Chronic back pain   . Hypertension     Patient Active Problem List   Diagnosis Date Noted  . Right knee pain 03/31/2017  . Arthritis of knee, right 03/31/2017  . Essential (primary) hypertension 02/05/2017  . Family history of diabetes mellitus 10/08/2016  . Left knee pain 01/10/2016  . Plantar fasciitis of left foot 01/10/2016  . Right shoulder pain 01/10/2016  . Constipation 12/17/2011    Past Surgical History:  Procedure Laterality Date  . APPENDECTOMY         Family History  Problem Relation Age of Onset  . Cancer Mother   . Diabetes Mother   . Diabetes Father     Social History   Tobacco Use  . Smoking status: Never Smoker  . Smokeless tobacco: Never Used  Substance Use Topics  . Alcohol use: No    Alcohol/week: 0.0 standard drinks  . Drug use: No    Home Medications Prior to Admission medications   Medication Sig Start Date End Date Taking? Authorizing Provider  diclofenac sodium (VOLTAREN) 1 % GEL Apply 4 g topically 4 (four) times daily. To affected joint. Patient not taking: Reported on 09/16/2017 03/31/17   Denyse Amass,  Michel Harrow, MD  escitalopram (LEXAPRO) 10 MG tablet Take 10 mg by mouth daily.    [provider]  lisinopril (PRINIVIL,ZESTRIL) 10 MG tablet Take 10 mg by mouth daily.    [provider]  Multiple Vitamin (MULTIVITAMIN WITH MINERALS) TABS tablet Take 1 tablet by mouth daily.    [provider]  naproxen (NAPROSYN) 500 MG tablet Take 1 tablet (500 mg total) by mouth 2 (two) times daily with a meal. 09/16/17   Rolan Bucco, MD    Allergies    Patient has no known allergies.  Review of Systems   Review of Systems  Constitutional: Negative for chills and fever.  HENT: Negative for ear pain and sore throat.   Eyes: Negative for photophobia and visual disturbance.  Respiratory: Negative for cough and  shortness of breath.   Cardiovascular: Positive for chest pain. Negative for palpitations.  Gastrointestinal: Negative for abdominal pain and vomiting.  Genitourinary: Negative for dysuria and hematuria.  Musculoskeletal: Negative for arthralgias and back pain.  Skin: Negative for color change and rash.  Neurological: Negative for syncope and light-headedness.  Psychiatric/Behavioral: Negative for agitation and confusion.  All other systems reviewed and are negative.   Physical Exam Updated Vital Signs BP (!) 136/99   Pulse (!) 113   Temp 98 F (36.7 C) (Oral)   Resp (!) 26   SpO2 99%   Physical Exam Vitals and nursing note reviewed.  Constitutional:      Appearance: He is well-developed.  HENT:     Head: Normocephalic and atraumatic.  Eyes:     Conjunctiva/sclera: Conjunctivae normal.  Cardiovascular:     Rate and Rhythm: Normal rate and regular rhythm.     Heart sounds: Normal heart sounds. No murmur.  Pulmonary:     Effort: Pulmonary effort is normal. No respiratory distress.     Breath sounds: Normal breath sounds.  Abdominal:     Palpations: Abdomen is soft.     Tenderness: There is no abdominal tenderness.  Musculoskeletal:     Cervical back: Neck supple.     Comments: Bilateral upper back trapezius trigger point tenderness  Skin:    General: Skin is warm and dry.  Neurological:     General: No focal deficit present.     Mental Status: He is alert and oriented to person, place, and time.     Comments: Negative spurling maneuver Negative cervical compression test  Psychiatric:        Mood and Affect: Mood normal.     ED Results / Procedures / Treatments   Labs (all labs ordered are listed, but only abnormal results are displayed) Labs Reviewed  BASIC METABOLIC PANEL - Abnormal; Notable for the following components:      Result Value   Glucose, Bld 116 (*)    All other components within normal limits  CBC  TROPONIN I (HIGH SENSITIVITY)    EKG EKG  Interpretation  Date/Time:  Sunday October 28 2019 13:58:39 EDT Ventricular Rate:  92 PR Interval:    QRS Duration: 88 QT Interval:  345 QTC Calculation: 427 R Axis:   69 Text Interpretation: Sinus rhythm 12 Lead; Mason-Likar No STEMI Confirmed by Alvester Chou [phone]) on 10/28/2019 2:08:04 PM   Radiology DG Chest 2 View  Result Date: 10/28/2019 CLINICAL DATA:  Chest pain EXAM: CHEST - 2 VIEW COMPARISON:  September 16, 2017 FINDINGS: Lungs are clear. Heart size and pulmonary vascularity are normal. No adenopathy. No pneumothorax. No bone lesions. IMPRESSION: No  abnormality noted. Electronically Signed   By: Lowella Grip III M.D.   On: 10/28/2019 14:25   DG Cervical Spine Complete  Result Date: 10/28/2019 CLINICAL DATA:  evaluate for evidence of cervical DDD (possible cervical angina presentation, works as a Theme park manager) Posterior cervical pain radiating into left shoulder. EXAM: CERVICAL SPINE - COMPLETE 4+ VIEW COMPARISON:  None. FINDINGS: Cervical spine alignment is maintained. Vertebral body heights are preserved. The dens is intact. Posterior elements appear well-aligned. There is no evidence of fracture. Mild disc space narrowing and endplate spurring at N0-U7. No significant bony neural foraminal narrowing. No prevertebral soft tissue edema. 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 Electronically Signed   By: Keith Rake M.D.   On: 10/28/2019 17:07    Procedures Procedures (including critical care time)  Medications Ordered in ED Medications  sodium chloride flush (NS) 0.9 % injection 3 mL (3 mLs Intravenous Given 10/28/19 1415)    ED Course  I have reviewed the triage vital signs and the nursing notes.  Pertinent labs & imaging results that were available during my care of the patient were reviewed by me and considered in my medical decision making (see chart for details).  This patient complains of chest pain .  This involves an extensive number of treatment  options, and is a complaint that carries with it a high risk of complications and morbidity.  The differential diagnosis includes ACS vs. PNA vs. Muscular pain vs. Gastritis vs peptic ulcer vs. Reflux vs. Cervical angina  Less likely PE with no respiratory symptoms, no PE risk factors (low well's score), and no current symptoms at rest.  He's also had a negative CT PE in the past 2 years when presenting with the same type of symptoms.  Less likely aortic dissection with no pain or symptoms in the room.  BP stable.  I ordered, reviewed, and interpreted labs, which included troponin, BMP, CBC, ecg, xray I ordered imaging studies which included xray cervical spine and chest I independently visualized and interpreted imaging which showed clear lung fields bilaterally, no widened mediastinum, no consolidations, no pneumothorax, no cervical spinal frature and the monitor tracing which showed NSR Previous records obtained and reviewed showing multipe prior ED visits at [facility] and [facility] for chest pan over past 2 years with negative troponin rule-outs and unremarkable CTPE in 2018 I consulted Dr Doylene Canard of cardiology and discussed lab and imaging findings as noted in the ED course below   After reviewing his records and consulting with the cardiologist, I re-evaluated the patient and he remained free of chest pain.  In my opinion, his pain may be related to a cervical nerve impingement or cervical angina, given his workplace posture with neck held in constant forward flexion.  His symptoms seem to revolve around being at work, specifically at this job, and therefore may be more postural than exertional in origin.   Clinical Course as of Oct 28 2330  Sun Oct 28, 2019  1637 I spoke to Dr Doylene Canard the cardiologist covering for Dr Terrence Dupont today.  WE reviewed the case and patient records.  Dr Doylene Canard agrees this is a generally low-risk cardiac patient (HEART SCORE < 3), and this may be related to cervical angina.   He did recommend initiating of 81 mg aspirin and toprol 25 mg daily, and having patient call Dr Zenia Resides office tomorrow.  He can get an outpatient stress test and w/u.  In the meantime as he is pain-free, it is reasonable to discharge  him today.   [MT]  1732 Patient already on aspirin 81 mg, and reports he has taken metoprolol in the past but had to stop because it made him "sleepy and dizzy."  We'll hold off on metoprolol then   [MT]    Clinical Course User Index [MT] Sidharth Leverette, Kermit Balo, MD   Final Clinical Impression(s) / ED Diagnoses Final diagnoses:  Chest pain, unspecified type  Neck pain    Rx / DC Orders ED Discharge Orders    None       Terald Sleeper, MD 10/28/19 2332

## 2019-10-28 NOTE — ED Triage Notes (Signed)
Patient here from home with complaints of mid upper chest pain that started 4 days ago. Spanish speaking. Denies n/v.

## 2020-09-20 ENCOUNTER — Ambulatory Visit (INDEPENDENT_AMBULATORY_CARE_PROVIDER_SITE_OTHER): Payer: 59

## 2020-09-20 DIAGNOSIS — Z23 Encounter for immunization: Secondary | ICD-10-CM

## 2020-09-20 NOTE — Progress Notes (Signed)
   Covid-19 Vaccination Clinic  Name:  Bryan [name]    MRN: [id number] DOB: 1975-11-24  09/20/2020  Mr. Bryan [name] was observed post Covid-19 immunization for 15 minutes without incident. He was provided with Vaccine Information Sheet and instruction to access the V-Safe system.   Mr. Bryan [name] was instructed to call 911 with any severe reactions post vaccine: Marland Kitchen Difficulty breathing  . Swelling of face and throat  . A fast heartbeat  . A bad rash all over body  . Dizziness and weakness   Immunizations Administered    Name Date Dose VIS Date Route   PFIZER Comrnaty(Gray TOP) Covid-19 Vaccine 09/20/2020 11:27 AM 0.3 mL 07/03/2020 Intramuscular   Manufacturer: [employer], [employer]   Lot: [id number]   NDC: [phone]

## 2020-09-27 ENCOUNTER — Ambulatory Visit: Payer: Self-pay

## 2021-10-13 ENCOUNTER — Other Ambulatory Visit: Payer: Self-pay

## 2021-10-13 ENCOUNTER — Emergency Department (HOSPITAL_COMMUNITY): Payer: 59

## 2021-10-13 ENCOUNTER — Emergency Department (HOSPITAL_COMMUNITY)
Admission: EM | Admit: 2021-10-13 | Discharge: 2021-10-13 | Disposition: A | Discharge status: Home / Self Care | Payer: 59 | Attending: Emergency Medicine | Admitting: Emergency Medicine

## 2021-10-13 DIAGNOSIS — R079 Chest pain, unspecified: Secondary | ICD-10-CM | POA: Diagnosis present

## 2021-10-13 DIAGNOSIS — Z79899 Other long term (current) drug therapy: Secondary | ICD-10-CM | POA: Diagnosis not present

## 2021-10-13 DIAGNOSIS — I10 Essential (primary) hypertension: Secondary | ICD-10-CM | POA: Insufficient documentation

## 2021-10-13 DIAGNOSIS — M542 Cervicalgia: Secondary | ICD-10-CM | POA: Insufficient documentation

## 2021-10-13 DIAGNOSIS — R0602 Shortness of breath: Secondary | ICD-10-CM | POA: Diagnosis not present

## 2021-10-13 LAB — BASIC METABOLIC PANEL
Anion gap: 9 (ref 5–15)
BUN: 14 mg/dL (ref 6–20)
CO2: 27 mmol/L (ref 22–32)
Calcium: 9 mg/dL (ref 8.9–10.3)
Chloride: 99 mmol/L (ref 98–111)
Creatinine, Ser: 0.89 mg/dL (ref 0.61–1.24)
GFR, Estimated: >60 mL/min (ref >60)
Glucose, Bld: 131 mg/dL — ABNORMAL HIGH (ref 70–99)
Potassium: 3.6 mmol/L (ref 3.5–5.1)
Sodium: 135 mmol/L (ref 135–145)

## 2021-10-13 LAB — TROPONIN I (HIGH SENSITIVITY)
Troponin I (High Sensitivity): <2 ng/L (ref <18)
Troponin I (High Sensitivity): <2 ng/L (ref <18)

## 2021-10-13 LAB — CBC
HCT: 44 % (ref 39.0–52.0)
Hemoglobin: 16.2 g/dL (ref 13.0–17.0)
MCH: 33.9 pg (ref 26.0–34.0)
MCHC: 36.8 g/dL — ABNORMAL HIGH (ref 30.0–36.0)
MCV: 92.1 fL (ref 80.0–100.0)
Platelets: 264 10*3/uL (ref 150–400)
RBC: 4.78 MIL/uL (ref 4.22–5.81)
RDW: 12.2 % (ref 11.5–15.5)
WBC: 6.6 10*3/uL (ref 4.0–10.5)
nRBC: 0 % (ref 0.0–0.2)

## 2021-10-13 MED ORDER — CYCLOBENZAPRINE HCL 10 MG PO TABS: 10.0000 mg | ORAL_TABLET | Freq: Two times a day (BID) | ORAL | 0 refills | Status: AC | PRN

## 2021-10-13 MED ORDER — NAPROXEN 500 MG PO TABS: 500.0000 mg | ORAL_TABLET | Freq: Two times a day (BID) | ORAL | 0 refills | Status: AC

## 2021-10-13 NOTE — ED Triage Notes (Signed)
Translator/pt stated, I went into work and I was having left chest pain , and arm pain. and felt like I cant get the air out. This started this morning, and Ive had the pain before . ?

## 2021-10-13 NOTE — Discharge Instructions (Addendum)
Por favor, haga un seguimiento con su cardi?logo. Tambi?n es una buena idea hablar con su proveedor de atenci?n primaria sobre su ansiedad. Envi? los relajantes musculares y el naproxeno a su farmacia. Recuerde que el relajante muscular ciclobenzaprina puede causarle cansancio, as? que no lo tome por la ma?ana antes del trabajo. ? ?Puede tomar Tylenol con estos medicamentos, pero evite el ibuprofeno. Lea la informaci?n sobre dolor de pecho y dolor muscular adjunta a estos documentos de alta. Los paquetes de calor tambi?n pueden ayudar a [employer] m?sculos, as? como los parches de lidoca?na o [employer]. ?

## 2021-10-13 NOTE — ED Provider Notes (Signed)
?[facility] ?Provider Note ? ? ?CSN: [id number] ?Arrival date & time: 10/13/21  0747 ? ?  ? ?History ? ?Chief Complaint  ?Patient presents with  ? Chest Pain  ? Shortness of Breath  ? Arm Pain  ? ? ?Bryan [name] is a 46 y.o. male with a past medical history of hypertension presenting today with with chest pain.  Reports that while he was at work this morning he felt discomfort which she describes as cramping to the left anterior chest, radiating somewhat into his left shoulder.  This has resolved.  He says that he has had multiple events such as this however today it felt like it was "inside his heart."  No history of ACS.  Reports seeing a cardiologist every 3 months for his hypertension.  Denies any accompanying dizziness or shortness of breath.  Also complaining of pain in his bilateral shoulders and neck.  Has not taken any over-the-counter medication for this.  He says it is worse with any movement and that he works in [employer] which exacerbates the pain. ? ?History was provided with the help of iPad interpreter ?Home Medications ?Prior to Admission medications   ?Medication Sig Start Date End Date Taking? Authorizing Provider  ?acetaminophen (TYLENOL) 500 MG tablet Take 1,000 mg by mouth every 8 (eight) hours as needed for mild pain.   Yes [provider]  ?cyclobenzaprine (FLEXERIL) 10 MG tablet Take 1 tablet (10 mg total) by mouth 2 (two) times daily as needed for muscle spasms. 10/13/21  Yes Demitrus Francisco A, PA-C  ?diltiazem (CARDIZEM CD) 240 MG 24 hr capsule Take 240 mg by mouth every morning. 08/12/21  Yes [provider]  ?gemfibrozil (LOPID) 600 MG tablet Take 600 mg by mouth 2 (two) times daily before a meal. 09/15/21  Yes [provider]  ?losartan-hydrochlorothiazide (HYZAAR) 100-12.5 MG tablet Take 1 tablet by mouth daily. 09/15/21  Yes [provider]  ?Multiple Vitamin (MULTIVITAMIN WITH MINERALS) TABS tablet  Take 1 tablet by mouth daily.   Yes [provider]  ?naproxen (NAPROSYN) 500 MG tablet Take 1 tablet (500 mg total) by mouth 2 (two) times daily with a meal for 14 days. 10/13/21 10/27/21 Yes Herron Fero A, PA-C  ?diclofenac sodium (VOLTAREN) 1 % GEL Apply 4 g topically 4 (four) times daily. To affected joint. ?Patient not taking: Reported on 09/16/2017 03/31/17   Rodolph Bongorey, Evan S, MD  ?escitalopram (LEXAPRO) 10 MG tablet Take 10 mg by mouth daily. ?Patient not taking: Reported on 10/13/2021    [provider]  ?lisinopril (PRINIVIL,ZESTRIL) 10 MG tablet Take 10 mg by mouth daily. ?Patient not taking: Reported on 10/13/2021    [provider]  ?   ? ?Allergies    ?Patient has no known allergies.   ? ?Review of Systems   ?Review of Systems  ?Respiratory:  Positive for shortness of breath.   ?Cardiovascular:  Positive for chest pain.  ?See HPI ?Physical Exam ?Updated Vital Signs ?BP 139/87   Pulse 84   Temp 98.2 ?F (36.8 ?C) (Oral)   Resp 20   SpO2 98%  ?Physical Exam ?Vitals and nursing note reviewed.  ?Constitutional:   ?   General: He is not in acute distress. ?   Appearance: Normal appearance. He is not ill-appearing.  ?HENT:  ?   Head: Normocephalic and atraumatic.  ?Eyes:  ?   General: No scleral icterus. ?   Conjunctiva/sclera: Conjunctivae normal.  ?Cardiovascular:  ?  Rate and Rhythm: Normal rate and regular rhythm.  ?Pulmonary:  ?   Effort: Pulmonary effort is normal. No respiratory distress.  ?   Breath sounds: Normal breath sounds.  ?Musculoskeletal:  ?   Comments: Full range of motion of neck and shoulders.  Chest, shoulder and back pain worse with movement.  Tenderness to palpation over trapezius bilaterally  ?Skin: ?   Findings: No rash.  ?Neurological:  ?   Mental Status: He is alert.  ?Psychiatric:     ?   Mood and Affect: Mood normal.  ? ? ?ED Results / Procedures / Treatments   ?Labs ?(all labs ordered are listed, but only abnormal results are displayed) ?Labs Reviewed   ?BASIC METABOLIC PANEL - Abnormal; Notable for the following components:  ?    Result Value  ? Glucose, Bld 131 (*)   ? All other components within normal limits  ?CBC - Abnormal; Notable for the following components:  ? MCHC 36.8 (*)   ? All other components within normal limits  ?TROPONIN I (HIGH SENSITIVITY)  ?TROPONIN I (HIGH SENSITIVITY)  ? ? ?EKG ?None ? ?Radiology ?DG Chest 2 View ? ?Result Date: 10/13/2021 ?CLINICAL DATA:  Chest pain EXAM: CHEST - 2 VIEW COMPARISON:  10/28/2019 FINDINGS: The heart size and mediastinal contours are within normal limits. Both lungs are clear. The visualized skeletal structures are unremarkable. IMPRESSION: No active cardiopulmonary disease. Electronically Signed   By: Ernie Avena M.D.   On: 10/13/2021 08:32   ? ?Procedures ?Procedures  ?Normal sinus rhythm with normal rate ?Medications Ordered in ED ?Medications - No data to display ? ?ED Course/ Medical Decision Making/ A&P ?  ?                        ?Medical Decision Making ?Amount and/or Complexity of Data Reviewed ?Labs: ordered. ?Radiology: ordered. ? ?Risk ?Prescription drug management. ? ? ?Patient presents to the ED for concern of chest pain. The emergent differential diagnosis of chest pain includes: Acute coronary syndrome, pericarditis, aortic dissection, pulmonary embolism, tension pneumothorax, and esophageal rupture. ? ?Co morbidities that complicate the patient evaluation include: Hypertension ? ?Per internal/external chart review: Patient has been seen for atypical chest pain multiple times in the past 5 years without cardiac explanation.  He has also been diagnosed with anxiety for which she used to take Lexapro.  He does not take this anymore.  Current medications do not include any psychiatric prescriptions. ? ? ?I performed a full physical exam, pertinent findings include: N/A ? ? ?Diagnostics: ? ?I ordered and viewed labs. The pertinent results include:  ?Negative troponin x2 ? ?I viewed patient's  x-ray which was negative. ? ? ?Cardiac Monitoring: ? ?The patient was maintained on a cardiac monitor.  I personally viewed and interpreted the cardiac monitored which showed normal sinus rhythm with a normal rate ? ?Consultations Obtained: ? ?Patient's heart score is 2, no cardio consult needed. ? ? ?Treatment: ? ?Denied any symptoms at this time, medications ordered. ? ? ?MDM/Disposition: ? ?I reevaluated the patient and found that they have : Improved.  He no longer has pain or discomfort.  I believe he would benefit from outpatient follow-up with cardiology with whom he is already established.  They can discuss his pains further if needed.  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  He requested a muscle relaxant and naproxen to see.  He requested medications and I be needs to follow-up with his primary care provider to discuss his anxiety. ? ?Final Clinical Impression(s) / ED Diagnoses ?Final diagnoses:  ?Nonspecific chest pain  ?Neck pain, musculoskeletal  ? ? ?Rx / DC Orders ?ED Discharge Orders   ? ?      Ordered  ?  naproxen (NAPROSYN) 500 MG tablet  2 times daily with meals       ? 10/13/21 1408  ?  cyclobenzaprine (FLEXERIL) 10 MG tablet  2 times daily PRN       ? 10/13/21 1408  ? ?  ?  ? ?  ? ?Results and diagnoses were explained to the patient. Return precautions discussed in full. Patient had no additional questions and expressed complete understanding. ? ? ?This chart was dictated using voice recognition software.  Despite best efforts to proofread,  errors can occur which can change the documentation meaning.  ?  ?Saddie Benders, PA-C ?10/13/21 1443 ? ?  ?Blane Ohara, MD ?10/14/21 [phone] ? ?

## 2022-11-01 ENCOUNTER — Emergency Department (HOSPITAL_COMMUNITY)
Admission: EM | Admit: 2022-11-01 | Discharge: 2022-11-01 | Disposition: A | Discharge status: Home / Self Care | Payer: 59 | Attending: Emergency Medicine | Admitting: Emergency Medicine

## 2022-11-01 ENCOUNTER — Other Ambulatory Visit: Payer: Self-pay

## 2022-11-01 ENCOUNTER — Emergency Department (HOSPITAL_COMMUNITY): Payer: 59

## 2022-11-01 ENCOUNTER — Encounter (HOSPITAL_COMMUNITY): Payer: Self-pay

## 2022-11-01 DIAGNOSIS — Z79899 Other long term (current) drug therapy: Secondary | ICD-10-CM | POA: Insufficient documentation

## 2022-11-01 DIAGNOSIS — M25511 Pain in right shoulder: Secondary | ICD-10-CM | POA: Diagnosis present

## 2022-11-01 MED ORDER — KETOROLAC TROMETHAMINE 15 MG/ML IJ SOLN
15.0000 mg | Freq: Once | INTRAMUSCULAR | Status: AC
  Administered 2022-11-01: 15 mg via INTRAMUSCULAR
  Filled 2022-11-01: qty 1

## 2022-11-01 MED ORDER — LIDOCAINE 5 % EX PTCH
1.0000 | MEDICATED_PATCH | CUTANEOUS | Status: DC
  Administered 2022-11-01: 1 via TRANSDERMAL
  Filled 2022-11-01: qty 1

## 2022-11-01 MED ORDER — NAPROXEN 500 MG PO TABS: 500.0000 mg | ORAL_TABLET | Freq: Two times a day (BID) | ORAL | 0 refills | Status: AC

## 2022-11-01 MED ORDER — LIDOCAINE 5 % EX PTCH: 1.0000 | MEDICATED_PATCH | CUTANEOUS | 0 refills | Status: AC

## 2022-11-01 NOTE — ED Provider Notes (Signed)
[place] EMERGENCY DEPARTMENT AT [facility] Provider Note   CSN: [id number] Arrival date & time: 11/01/22  0053     History  Chief Complaint  Patient presents with   Shoulder Pain    Bryan [name] is a 47 y.o. male.  47 year old male presents to the emergency department for evaluation of right shoulder pain.  He was at work and lifting a heavy pipe to pass to a colleague, when he felt a pull in his right shoulder.  He has been experiencing ongoing right shoulder pain which is aggravated with movement.  It has been unrelieved with massage.  He has taken a muscle relaxer for pain which helps temporarily, but the pain returns.  He is right-hand dominant.  Denies any extremity numbness or paresthesias, fevers.  He is not presently followed by an orthopedist. He is right hand dominant.  The history is provided by the patient. No language interpreter was used.  Shoulder Pain      Home Medications Prior to Admission medications   Medication Sig Start Date End Date Taking? Authorizing Provider  lidocaine (LIDODERM) 5 % Place 1 patch onto the skin daily. Remove & Discard patch within 12 hours or as directed by MD 11/01/22  Yes Antony Madura, PA-C  naproxen (NAPROSYN) 500 MG tablet Take 1 tablet (500 mg total) by mouth 2 (two) times daily with a meal. 11/01/22  Yes Antony Madura, PA-C  acetaminophen (TYLENOL) 500 MG tablet Take 1,000 mg by mouth every 8 (eight) hours as needed for mild pain.    [provider]  cyclobenzaprine (FLEXERIL) 10 MG tablet Take 1 tablet (10 mg total) by mouth 2 (two) times daily as needed for muscle spasms. 10/13/21   Redwine, Madison A, PA-C  diltiazem (CARDIZEM CD) 240 MG 24 hr capsule Take 240 mg by mouth every morning. 08/12/21   [provider]  gemfibrozil (LOPID) 600 MG tablet Take 600 mg by mouth 2 (two) times daily before a meal. 09/15/21   [provider]  losartan-hydrochlorothiazide (HYZAAR) 100-12.5 MG  tablet Take 1 tablet by mouth daily. 09/15/21   [provider]  Multiple Vitamin (MULTIVITAMIN WITH MINERALS) TABS tablet Take 1 tablet by mouth daily.    [provider]      Allergies    Patient has no known allergies.    Review of Systems   Review of Systems Ten systems reviewed and are negative for acute change, except as noted in the HPI.    Physical Exam Updated Vital Signs BP (!) 158/113 (BP Location: Right Arm)   Pulse (!) 101   Temp 97.9 F (36.6 C) (Oral)   Resp 18   Wt 113.4 kg   SpO2 95%   BMI 36.36 kg/m   Physical Exam Vitals and nursing note reviewed.  Constitutional:      General: He is not in acute distress.    Appearance: He is well-developed. He is not diaphoretic.     Comments: Nontoxic appearing and in NAD  HENT:     Head: Normocephalic and atraumatic.  Eyes:     General: No scleral icterus.    Conjunctiva/sclera: Conjunctivae normal.  Cardiovascular:     Rate and Rhythm: Normal rate and regular rhythm.     Pulses: Normal pulses.     Comments: Distal radial pulse 2+ in the RUE Pulmonary:     Effort: Pulmonary effort is normal. No respiratory distress.     Comments: Respirations even and unlabored Musculoskeletal:  General: Normal range of motion.     Cervical back: Normal range of motion.     Comments: Preserved AROM and PROM of the R shoulder. No deformity or crepitus. Positive empty can test on the right. Preserved strength against resistance in the RUE. Grip strength 5/5.  Skin:    General: Skin is warm and dry.     Coloration: Skin is not pale.     Findings: No erythema or rash.  Neurological:     Mental Status: He is alert and oriented to person, place, and time.     Coordination: Coordination normal.  Psychiatric:        Behavior: Behavior normal.     ED Results / Procedures / Treatments   Labs (all labs ordered are listed, but only abnormal results are displayed) Labs Reviewed - No data to  display  EKG None  Radiology DG Shoulder Right  Result Date: 11/01/2022 CLINICAL DATA:  Shoulder pain for several days, no known injury, initial encounter EXAM: RIGHT SHOULDER - 2+ VIEW COMPARISON:  10/08/2016 FINDINGS: Mild degenerative changes of the acromioclavicular joint are seen. No acute fracture or dislocation is seen. No soft tissue changes are noted. IMPRESSION: Degenerative changes of the acromioclavicular joint without acute abnormality. Electronically Signed   By: Alcide Clever M.D.   On: 11/01/2022 03:02    Procedures Procedures    Medications Ordered in ED Medications  lidocaine (LIDODERM) 5 % 1 patch (1 patch Transdermal Patch Applied 11/01/22 0301)  ketorolac (TORADOL) 15 MG/ML injection 15 mg (15 mg Intramuscular Given 11/01/22 0302)    ED Course/ Medical Decision Making/ A&P                             Medical Decision Making Amount and/or Complexity of Data Reviewed Radiology: ordered.  Risk Prescription drug management.   This patient presents to the ED for concern of R shoulder pain, this involves an extensive number of treatment options, and is a complaint that carries with it a high risk of complications and morbidity.  The differential diagnosis includes sprain/strain vs AC joint separation vs dislocation vs fracture   Co morbidities that complicate the patient evaluation  Obesity    Additional history obtained:  Additional history obtained from wife, at bedside External records from outside source obtained and reviewed including negative R shoulder Xray from 2018   Imaging Studies ordered:  I ordered imaging studies including R shoulder Xray  I independently visualized and interpreted imaging which showed degenerative AC joint changes without acute pathology I agree with the radiologist interpretation   Medicines ordered and prescription drug management:  I ordered medication including Toradol and Lidoderm patch for pain  I have reviewed the  patients home medicines and have made adjustments as needed   Problem List / ED Course:  Patient neurovascularly intact on exam. Imaging negative for fracture, dislocation, bony deformity. No swelling, erythema, heat to touch to the affected area; no concern for septic joint. Compartments in the affected extremity are soft.  Plan for supportive management including RICE and NSAIDs; primary care follow up as needed. Return precautions discussed and provided. Patient discharged in stable condition with no unaddressed concerns.   Reevaluation:  After the interventions noted above, I reevaluated the patient and found that they have :stayed the same   Social Determinants of Health:  Language barrier   Dispostion:  After consideration of the diagnostic results and the patients response to treatment,  I feel that the patent would benefit from outpatient f/u with Orthopedics. Advised Naproxen and lidoderm patches for pain. Return precautions provided. Patient discharged in stable condition with no unaddressed concerns.          Final Clinical Impression(s) / ED Diagnoses Final diagnoses:  Acute pain of right shoulder    Rx / DC Orders ED Discharge Orders          Ordered    Ambulatory referral to Orthopedic Surgery        11/01/22 0323    naproxen (NAPROSYN) 500 MG tablet  2 times daily with meals        11/01/22 0325    lidocaine (LIDODERM) 5 %  Every 24 hours        11/01/22 0325              Antony MaduraHumes, Breland Elders, PA-C 11/01/22 0412    Palumbo, April, MD 11/01/22 [phone]

## 2022-11-01 NOTE — Discharge Instructions (Addendum)
Your x-ray showed degenerative changes of your right shoulder without evidence of broken bone or dislocation.  We recommend follow-up with orthopedics for further evaluation of your ongoing shoulder pain.  You have been prescribed naproxen and Lidoderm patches to use for pain control.  Avoid strenuous activity and heavy lifting.  Return for new or concerning symptoms.

## 2022-11-01 NOTE — ED Triage Notes (Signed)
Patient reports right shoulder pain since Thursday. States he does heavy lifting at work and thinks he injured it then. No deformity noted. Has taken muscle relaxers at home with no relief.

## 2023-04-22 ENCOUNTER — Emergency Department (HOSPITAL_COMMUNITY)
Admission: EM | Admit: 2023-04-22 | Discharge: 2023-04-22 | Disposition: A | Discharge status: Home / Self Care | Payer: 59 | Attending: Emergency Medicine | Admitting: Emergency Medicine

## 2023-04-22 ENCOUNTER — Other Ambulatory Visit: Payer: Self-pay

## 2023-04-22 ENCOUNTER — Encounter (HOSPITAL_COMMUNITY): Payer: Self-pay | Admitting: Emergency Medicine

## 2023-04-22 ENCOUNTER — Emergency Department (HOSPITAL_COMMUNITY): Payer: 59

## 2023-04-22 DIAGNOSIS — Z79899 Other long term (current) drug therapy: Secondary | ICD-10-CM | POA: Insufficient documentation

## 2023-04-22 DIAGNOSIS — M25561 Pain in right knee: Secondary | ICD-10-CM | POA: Diagnosis present

## 2023-04-22 DIAGNOSIS — S8391XA Sprain of unspecified site of right knee, initial encounter: Secondary | ICD-10-CM | POA: Diagnosis not present

## 2023-04-22 DIAGNOSIS — I10 Essential (primary) hypertension: Secondary | ICD-10-CM | POA: Diagnosis not present

## 2023-04-22 DIAGNOSIS — X58XXXA Exposure to other specified factors, initial encounter: Secondary | ICD-10-CM | POA: Diagnosis not present

## 2023-04-22 MED ORDER — ACETAMINOPHEN 325 MG PO TABS
650.0000 mg | ORAL_TABLET | Freq: Once | ORAL | Status: AC
  Administered 2023-04-22: 650 mg via ORAL
  Filled 2023-04-22: qty 2

## 2023-04-22 MED ORDER — IBUPROFEN 600 MG PO TABS: 600.0000 mg | ORAL_TABLET | Freq: Four times a day (QID) | ORAL | 0 refills | Status: AC | PRN

## 2023-04-22 MED ORDER — ACETAMINOPHEN 500 MG PO TABS: 500.0000 mg | ORAL_TABLET | Freq: Four times a day (QID) | ORAL | 0 refills | Status: AC | PRN

## 2023-04-22 MED ORDER — NAPROXEN 500 MG PO TABS
500.0000 mg | ORAL_TABLET | Freq: Once | ORAL | Status: AC
  Administered 2023-04-22: 500 mg via ORAL
  Filled 2023-04-22: qty 1

## 2023-04-22 MED ORDER — OXYCODONE-ACETAMINOPHEN 5-325 MG PO TABS: 1.0000 | ORAL_TABLET | Freq: Two times a day (BID) | ORAL | 0 refills | Status: AC | PRN

## 2023-04-22 NOTE — ED Provider Notes (Signed)
[place] EMERGENCY DEPARTMENT AT [facility] Provider Note   CSN: [id number] Arrival date & time: 04/22/23  4401     History  Chief Complaint  Patient presents with   Knee Pain    R    Bryan [name] Bryan [name] is a 47 y.o. male.  HPI    PT with hx of HTN with R knee pain x 3 days. Spanish interpreter used.  Pain is constant, worse with movement, no preceding trauma. Pt denies any history of gout and denies any immunosuppressive condition. No n/v/f/c. PT has taken OTC meds w.o relief.  Pt denies hx of heavy alcohol use or regular red meat consumption. Denies high risk sexual behavior.  Home Medications Prior to Admission medications   Medication Sig Start Date End Date Taking? Authorizing Provider  acetaminophen (TYLENOL) 500 MG tablet Take 1 tablet (500 mg total) by mouth every 6 (six) hours as needed. 04/22/23  Yes Derwood Kaplan, MD  acetaminophen (TYLENOL) 500 MG tablet Take 1 tablet (500 mg total) by mouth every 6 (six) hours as needed. 04/22/23  Yes Derwood Kaplan, MD  ibuprofen (ADVIL) 600 MG tablet Take 1 tablet (600 mg total) by mouth every 6 (six) hours as needed. 04/22/23  Yes Derwood Kaplan, MD  ibuprofen (ADVIL) 600 MG tablet Take 1 tablet (600 mg total) by mouth every 6 (six) hours as needed. 04/22/23  Yes Derwood Kaplan, MD  oxyCODONE-acetaminophen (PERCOCET/ROXICET) 5-325 MG tablet Take 1 tablet by mouth every 12 (twelve) hours as needed for severe pain. 04/22/23  Yes Derwood Kaplan, MD  cyclobenzaprine (FLEXERIL) 10 MG tablet Take 1 tablet (10 mg total) by mouth 2 (two) times daily as needed for muscle spasms. 10/13/21   Redwine, Madison A, PA-C  diltiazem (CARDIZEM CD) 240 MG 24 hr capsule Take 240 mg by mouth every morning. 08/12/21   [provider]  gemfibrozil (LOPID) 600 MG tablet Take 600 mg by mouth 2 (two) times daily before a meal. 09/15/21   [provider]  lidocaine (LIDODERM) 5 % Place 1 patch onto the skin daily.  Remove & Discard patch within 12 hours or as directed by MD 11/01/22   Antony Madura, PA-C  losartan-hydrochlorothiazide (HYZAAR) 100-12.5 MG tablet Take 1 tablet by mouth daily. 09/15/21   [provider]  Multiple Vitamin (MULTIVITAMIN WITH MINERALS) TABS tablet Take 1 tablet by mouth daily.    [provider]  naproxen (NAPROSYN) 500 MG tablet Take 1 tablet (500 mg total) by mouth 2 (two) times daily with a meal. 11/01/22   Antony Madura, PA-C      Allergies    Patient has no known allergies.    Review of Systems   Review of Systems  All other systems reviewed and are negative.   Physical Exam Updated Vital Signs BP (!) 148/108   Pulse 80   Temp 99.2 F (37.3 C) (Oral)   Resp 18   SpO2 95%  Physical Exam Vitals and nursing note reviewed.  Constitutional:      Appearance: He is well-developed.  HENT:     Head: Atraumatic.  Cardiovascular:     Rate and Rhythm: Normal rate.  Pulmonary:     Effort: Pulmonary effort is normal.  Musculoskeletal:        General: Swelling present.     Cervical back: Neck supple.     Comments: ROM is limited over the R knee, but pt able to flex  Skin:    General: Skin is warm.  Findings: No erythema.     Comments: R knee has no warmth or redness  Neurological:     Mental Status: He is alert and oriented to person, place, and time.     ED Results / Procedures / Treatments   Labs (all labs ordered are listed, but only abnormal results are displayed) Labs Reviewed - No data to display  EKG None  Radiology DG Knee Complete 4 Views Right  Result Date: 04/22/2023 CLINICAL DATA:  Right knee pain with swelling EXAM: RIGHT KNEE - COMPLETE 4+ VIEW COMPARISON:  03/31/2017 FINDINGS: No evidence of fracture, dislocation, or joint effusion. Mild degenerative marginal spurring which is tricompartmental. IMPRESSION: 1. No acute finding. 2. Mild degenerative spurring. Electronically Signed   By: Tiburcio Pea M.D.   On: 04/22/2023  08:29    Procedures Procedures    Medications Ordered in ED Medications  naproxen (NAPROSYN) tablet 500 mg (500 mg Oral Given 04/22/23 0747)  acetaminophen (TYLENOL) tablet 650 mg (650 mg Oral Given 04/22/23 0747)    ED Course/ Medical Decision Making/ A&P                                 Medical Decision Making Amount and/or Complexity of Data Reviewed Radiology: ordered.  Risk OTC drugs. Prescription drug management.  Pt comes in with cc of knee pain. 3 days, constant, worse with movement, but able to flex the knee and ambulate (with pain).  Differential considered includes osteoarthritis, gout, septic arthritis, meniscus injury, baker's cyst.  Clinical suspicion for septic arthritis extremely low- no risk factors and no concerning finding on hx or exam.   Final Clinical Impression(s) / ED Diagnoses Final diagnoses:  Acute pain of right knee  Sprain of right knee, unspecified ligament, initial encounter    Rx / DC Orders ED Discharge Orders          Ordered    ibuprofen (ADVIL) 600 MG tablet  Every 6 hours PRN        04/22/23 0734    acetaminophen (TYLENOL) 500 MG tablet  Every 6 hours PRN        04/22/23 0734    ibuprofen (ADVIL) 600 MG tablet  Every 6 hours PRN        04/22/23 0949    acetaminophen (TYLENOL) 500 MG tablet  Every 6 hours PRN        04/22/23 0949    oxyCODONE-acetaminophen (PERCOCET/ROXICET) 5-325 MG tablet  Every 12 hours PRN        04/22/23 0949              Derwood Kaplan, MD 04/22/23 [phone]

## 2023-04-22 NOTE — Discharge Instructions (Addendum)
Please call orthopedic doctor for a follow-up in 10 to 14 days and see them if your symptoms are not improving.

## 2023-04-22 NOTE — ED Triage Notes (Signed)
Pt c/o right knee pain with swelling. Denies any injury. No chills/fevers. Requires spanish speaking interpreter.

## 2023-04-28 ENCOUNTER — Encounter: Payer: Self-pay | Admitting: Surgical

## 2023-04-28 ENCOUNTER — Ambulatory Visit (INDEPENDENT_AMBULATORY_CARE_PROVIDER_SITE_OTHER): Payer: 59 | Admitting: Surgical

## 2023-04-28 DIAGNOSIS — M25561 Pain in right knee: Secondary | ICD-10-CM | POA: Diagnosis not present

## 2023-04-28 MED ORDER — LIDOCAINE HCL 1 % IJ SOLN
5.0000 mL | INTRAMUSCULAR | Status: AC | PRN
Start: 2023-04-28 — End: 2023-04-28
  Administered 2023-04-28: 5 mL

## 2023-04-28 MED ORDER — METHYLPREDNISOLONE ACETATE 40 MG/ML IJ SUSP
40.0000 mg | INTRAMUSCULAR | Status: AC | PRN
Start: 2023-04-28 — End: 2023-04-28
  Administered 2023-04-28: 40 mg via INTRA_ARTICULAR

## 2023-04-28 MED ORDER — BUPIVACAINE HCL 0.25 % IJ SOLN
4.0000 mL | INTRAMUSCULAR | Status: AC | PRN
Start: 2023-04-28 — End: 2023-04-28
  Administered 2023-04-28: 4 mL via INTRA_ARTICULAR

## 2023-04-28 NOTE — Progress Notes (Signed)
Office Visit Note   Patient: Bryan [name]           Date of Birth: 08/31/1975           MRN: [id number] Visit Date: 04/28/2023 Requested by: Sandre Kitty, PA-C [address] [place],  [place] [address] PCP: Sandre Kitty, PA-C  Subjective: Chief Complaint  Patient presents with   Right Knee - Pain    HPI: Bryan [name] is a 47 y.o. male who presents to the office reporting right knee pain.  Patient reports pain over the last 2 to 3 weeks without any history of injury.  He is a Designer, fashion/clothing and states that he has noticed more more soreness after the day of work.  Describes medial pain without mechanical symptoms.  No history of prior knee surgery.  No groin pain or current change in his chronic low back pain.  He did used to play a lot of soccer and recently about 4 weeks ago he played in his first soccer pickup match in about several years.  Denies any history of gout.  No fevers or chills.  He is able to ambulate without assistance though does become sore if he walks too much.  Has been seen by Dr. Clementeen Graham in the past with MRI of the right knee from 2018 demonstrating medial meniscus tear.  He has had prior cortisone injection in his knee though he cannot remember when this was and he feels like this was helpful for him..                ROS: All systems reviewed are negative as they relate to the chief complaint within the history of present illness.  Patient denies fevers or chills.  Assessment & Plan: Visit Diagnoses:  1. Medial joint line tenderness of knee, right     Plan: Patient is a 47 year old male who presents for evaluation of right knee pain.  Has 2 to 3-week history of atraumatic onset of right knee pain with pain localizing to the medial aspect of the knee.  Has tenderness over the medial joint line.  Had radiographs of the right knee at his recent emergency department visit demonstrating minimal degenerative changes of the knee with  osteophytic lipping of the medial tibial plateau and of the patella.  Does have history of prior MRI from 6 years ago demonstrating medial meniscus tear.  Never had this addressed surgically.  After discussion of options, patient would like to forego MRI scan for now and try cortisone injection to see if this will help with his symptoms.  This was administered and patient tolerated procedure well.  He will follow-up for clinical recheck in 6 weeks with Dr. August Saucer.    Follow-Up Instructions: No follow-ups on file.   Orders:  No orders of the defined types were placed in this encounter.  No orders of the defined types were placed in this encounter.     Procedures: Large Joint Inj: R knee on 04/28/2023 4:58 PM Indications: diagnostic evaluation, joint swelling and pain Details: 18 G 1.5 in needle, superolateral approach  Arthrogram: No  Medications: 5 mL lidocaine 1 %; 40 mg methylPREDNISolone acetate 40 MG/ML; 4 mL bupivacaine 0.25 % Outcome: tolerated well, no immediate complications Procedure, treatment alternatives, risks and benefits explained, specific risks discussed. Consent was given by the patient. Immediately prior to procedure a time out was called to verify the correct patient, procedure, equipment, support staff and site/side marked as required.  Patient was prepped and draped in the usual sterile fashion.       Clinical Data: No additional findings.  Objective: Vital Signs: There were no vitals taken for this visit.  Physical Exam:  Constitutional: Patient appears well-developed HEENT:  Head: Normocephalic Eyes:EOM are normal Neck: Normal range of motion Cardiovascular: Normal rate Pulmonary/chest: Effort normal Neurologic: Patient is alert Skin: Skin is warm Psychiatric: Patient has normal mood and affect  Ortho Exam: Ortho exam demonstrates a trace effusion in the right knee.  Tenderness over the medial joint line.  No tenderness over the lateral joint line.   Able to ambulate without assistance.  No pain with hip range of motion.  Stable to anterior and posterior drawer sign.  No calf tenderness.  Negative Homans' sign.  No cellulitis or skin changes noted.  No tenderness over the prepatellar bursa.  Excellent quad strength rated 5/5.  Specialty Comments:  No specialty comments available.  Imaging: No results found.   PMFS History: Patient Active Problem List   Diagnosis Date Noted   Right knee pain 03/31/2017   Arthritis of knee, right 03/31/2017   Essential (primary) hypertension 02/05/2017   Family history of diabetes mellitus 10/08/2016   Left knee pain 01/10/2016   Plantar fasciitis of left foot 01/10/2016   Right shoulder pain 01/10/2016   Constipation 12/17/2011   Past Medical History:  Diagnosis Date   Chronic back pain    Hypertension     Family History  Problem Relation Age of Onset   Cancer Mother    Diabetes Mother    Diabetes Father     Past Surgical History:  Procedure Laterality Date   APPENDECTOMY     Social History   Occupational History   Not on file  Tobacco Use   Smoking status: Never   Smokeless tobacco: Never  Substance and Sexual Activity   Alcohol use: No    Alcohol/week: 0.0 standard drinks of alcohol   Drug use: No   Sexual activity: Never    Birth control/protection: Abstinence

## 2023-05-18 ENCOUNTER — Emergency Department (HOSPITAL_COMMUNITY): Payer: 59

## 2023-05-18 ENCOUNTER — Other Ambulatory Visit: Payer: Self-pay

## 2023-05-18 ENCOUNTER — Emergency Department (HOSPITAL_COMMUNITY)
Admission: EM | Admit: 2023-05-18 | Discharge: 2023-05-18 | Disposition: A | Discharge status: Home / Self Care | Payer: 59 | Attending: Emergency Medicine | Admitting: Emergency Medicine

## 2023-05-18 DIAGNOSIS — R079 Chest pain, unspecified: Secondary | ICD-10-CM | POA: Diagnosis present

## 2023-05-18 DIAGNOSIS — Z79899 Other long term (current) drug therapy: Secondary | ICD-10-CM | POA: Diagnosis not present

## 2023-05-18 DIAGNOSIS — I10 Essential (primary) hypertension: Secondary | ICD-10-CM | POA: Diagnosis not present

## 2023-05-18 LAB — BASIC METABOLIC PANEL
Anion gap: 12 (ref 5–15)
BUN: 16 mg/dL (ref 6–20)
CO2: 26 mmol/L (ref 22–32)
Calcium: 9.2 mg/dL (ref 8.9–10.3)
Chloride: 99 mmol/L (ref 98–111)
Creatinine, Ser: 0.79 mg/dL (ref 0.61–1.24)
GFR, Estimated: >60 mL/min (ref >60)
Glucose, Bld: 103 mg/dL — ABNORMAL HIGH (ref 70–99)
Potassium: 3.5 mmol/L (ref 3.5–5.1)
Sodium: 137 mmol/L (ref 135–145)

## 2023-05-18 LAB — CBC
HCT: 41.5 % (ref 39.0–52.0)
Hemoglobin: 14.7 g/dL (ref 13.0–17.0)
MCH: 33.6 pg (ref 26.0–34.0)
MCHC: 35.4 g/dL (ref 30.0–36.0)
MCV: 94.7 fL (ref 80.0–100.0)
Platelets: 254 10*3/uL (ref 150–400)
RBC: 4.38 MIL/uL (ref 4.22–5.81)
RDW: 12.3 % (ref 11.5–15.5)
WBC: 9.8 10*3/uL (ref 4.0–10.5)
nRBC: 0 % (ref 0.0–0.2)

## 2023-05-18 LAB — TROPONIN I (HIGH SENSITIVITY): Troponin I (High Sensitivity): <2 ng/L (ref <18)

## 2023-05-18 NOTE — ED Provider Notes (Signed)
[place] EMERGENCY DEPARTMENT AT [facility] Provider Note   CSN: [id number] Arrival date & time: 05/18/23  1909     History  Chief Complaint  Patient presents with   Chest Pain    Bryan [name] is a 47 y.o. male who is primarily Spanish-speaking, Spanish translator was used, presented to the ED with complaint of chest pain.  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  He says he has similar symptoms in the past but never this intense.  He sees a cardiologist, Dr Sharyn Lull, and does have a history of high blood pressure and high cholesterol, but denies any history of coronary disease, MI, cardiac stents, smoking.  Patient denies ongoing or daily exertional chest pain.  He is currently pain-free at this time.  He says occasionally he will feel subjective shortness of breath intermittently, often at work.  He does report he has high level of stress and anxiety.  He is here with his wife at bedside.  HPI     Home Medications Prior to Admission medications   Medication Sig Start Date End Date Taking? Authorizing Provider  acetaminophen (TYLENOL) 500 MG tablet Take 1 tablet (500 mg total) by mouth every 6 (six) hours as needed. 04/22/23   Derwood Kaplan, MD  acetaminophen (TYLENOL) 500 MG tablet Take 1 tablet (500 mg total) by mouth every 6 (six) hours as needed. 04/22/23   Derwood Kaplan, MD  cyclobenzaprine (FLEXERIL) 10 MG tablet Take 1 tablet (10 mg total) by mouth 2 (two) times daily as needed for muscle spasms. 10/13/21   Redwine, Madison A, PA-C  diltiazem (CARDIZEM CD) 240 MG 24 hr capsule Take 240 mg by mouth every morning. 08/12/21   [provider]  gemfibrozil (LOPID) 600 MG tablet Take 600 mg by mouth 2 (two) times daily before a meal. 09/15/21   [provider]  ibuprofen (ADVIL) 600 MG tablet Take 1 tablet (600 mg total) by  mouth every 6 (six) hours as needed. 04/22/23   Derwood Kaplan, MD  ibuprofen (ADVIL) 600 MG tablet Take 1 tablet (600 mg total) by mouth every 6 (six) hours as needed. 04/22/23   Derwood Kaplan, MD  lidocaine (LIDODERM) 5 % Place 1 patch onto the skin daily. Remove & Discard patch within 12 hours or as directed by MD 11/01/22   Antony Madura, PA-C  losartan-hydrochlorothiazide (HYZAAR) 100-12.5 MG tablet Take 1 tablet by mouth daily. 09/15/21   [provider]  Multiple Vitamin (MULTIVITAMIN WITH MINERALS) TABS tablet Take 1 tablet by mouth daily.    [provider]  naproxen (NAPROSYN) 500 MG tablet Take 1 tablet (500 mg total) by mouth 2 (two) times daily with a meal. 11/01/22   Antony Madura, PA-C  oxyCODONE-acetaminophen (PERCOCET/ROXICET) 5-325 MG tablet Take 1 tablet by mouth every 12 (twelve) hours as needed for severe pain. 04/22/23   Derwood Kaplan, MD      Allergies    Patient has no known allergies.    Review of Systems   Review of Systems  Physical Exam Updated Vital Signs BP (!) 131/93   Pulse 66   Temp 99 F (37.2 C) (Oral)   Resp 19   Ht 5\' 10"  (1.778 m)   Wt 112 kg   SpO2 97%   BMI 35.44 kg/m  Physical Exam Constitutional:      General: He is  not in acute distress. HENT:     Head: Normocephalic and atraumatic.  Eyes:     Conjunctiva/sclera: Conjunctivae normal.     Pupils: Pupils are equal, round, and reactive to light.  Cardiovascular:     Rate and Rhythm: Normal rate and regular rhythm.  Pulmonary:     Effort: Pulmonary effort is normal. No respiratory distress.  Abdominal:     General: There is no distension.     Tenderness: There is no abdominal tenderness.  Skin:    General: Skin is warm and dry.  Neurological:     General: No focal deficit present.     Mental Status: He is alert. Mental status is at baseline.  Psychiatric:        Mood and Affect: Mood normal.        Behavior: Behavior normal.     ED Results / Procedures /  Treatments   Labs (all labs ordered are listed, but only abnormal results are displayed) Labs Reviewed  BASIC METABOLIC PANEL - Abnormal; Notable for the following components:      Result Value   Glucose, Bld 103 (*)    All other components within normal limits  CBC  TROPONIN I (HIGH SENSITIVITY)    EKG EKG Interpretation Date/Time:  Wednesday May 18 2023 19:25:49 EDT Ventricular Rate:  73 PR Interval:  186 QRS Duration:  87 QT Interval:  356 QTC Calculation: 393 R Axis:   71  Text Interpretation: Sinus rhythm ST elev, probable normal early repol pattern Baseline wander in lead(s) V1 Confirmed by Alvester Chou [phone]) on 05/18/2023 9:51:36 PM  Radiology DG Chest 2 View  Result Date: 05/18/2023 CLINICAL DATA:  Chest pain. EXAM: CHEST - 2 VIEW COMPARISON:  10/13/2021. FINDINGS: The heart size and mediastinal contours are within normal limits. No consolidation, effusion, or pneumothorax. No acute osseous abnormality. IMPRESSION: No active cardiopulmonary disease. Electronically Signed   By: Thornell Sartorius M.D.   On: 05/18/2023 21:32    Procedures Procedures    Medications Ordered in ED Medications - No data to display  ED Course/ Medical Decision Making/ A&P                                 Medical Decision Making Amount and/or Complexity of Data Reviewed Labs: ordered. Radiology: ordered.   This patient presents to the Emergency Department with complaint of chest pain. This involves an extensive number of treatment options, and is a complaint that carries with it a high risk of complications and morbidity, given the patient's comorbidity, including high cholesterol, high blood pressure.The differential diagnosis includes ACS vs Pneumothorax vs Reflux/Gastritis vs MSK pain vs Pneumonia vs other.  I felt PE was less likely given that patient has no acute risk factors for PE, no tachycardia or hypoxia, symptoms have resolved  I ordered, reviewed, and interpreted  labs.  Pertinent results include normal blood test.  Troponin undetectable The patient was chest pain free and not requiring medications in the ED I ordered imaging studies which included x-ray of the chest I independently visualized and interpreted imaging which showed no emergent finding, and the monitor tracing which showed NSR . I agree with the radiologist interpretation  I personally reviewed the patients ECG which showed sinus rhythm with no acute ischemic findings  A Spanish translator was used my full history and exam  After the interventions stated above, I reevaluated the patient and found that they  were asymptomatic  Based on the patient's clinical exam, vital signs, risk factors, and ED testing, I felt that the patient's overall risk of life-threatening emergency such as ACS, PE, sepsis, or infection was low.  At this time, I felt the patient's presentation was most clinically consistent with nonspecific chest pain, but explained to the patient that this evaluation was not a definitive diagnostic workup.  I discussed outpatient follow up with primary care provider, and provided specialist office number on the patient's discharge paper if a referral was deemed necessary.  Return precautions were discussed with the patient.  I felt the patient was clinically stable for discharge.         Final Clinical Impression(s) / ED Diagnoses Final diagnoses:  Chest pain, unspecified type    Rx / DC Orders ED Discharge Orders     None         Terald Sleeper, MD 05/18/23 2308

## 2023-05-18 NOTE — ED Triage Notes (Signed)
Patent states he has been feeling "small cramps in his heart ans shortness of breath". Shortness of breath started "a while ago" and the cramps started while he was working. Patient works in Insurance account manager. Patient states he had chest pain in the past a year ago and was seen here at [facility]. Patient was told his chest pain was related to stress and anxiety. Patient was referred to a cardiologist. Chest pain is rated 8/10, intermittent. Denies blood thinners.

## 2023-06-06 ENCOUNTER — Ambulatory Visit: Payer: 59 | Admitting: Orthopedic Surgery

## 2024-05-28 ENCOUNTER — Encounter: Payer: Self-pay | Admitting: Radiology
